# Patient Record
Sex: Female | Born: 1985 | Hispanic: Yes | Marital: Married | Smoking: Never smoker
Health system: Southern US, Community
[De-identification: ages and names within clinical notes are randomized; demographics above are authoritative.]

## PROBLEM LIST (undated history)

## (undated) ENCOUNTER — Inpatient Hospital Stay (HOSPITAL_COMMUNITY): Payer: Self-pay

## (undated) DIAGNOSIS — Z789 Other specified health status: Secondary | ICD-10-CM

## (undated) HISTORY — PX: NO PAST SURGERIES: SHX2092

## (undated) HISTORY — DX: Other specified health status: Z78.9

---

## 2019-10-12 NOTE — L&D Delivery Note (Addendum)
OB/GYN Faculty Practice Delivery Note  Darlene Gallagher is a 34 y.o. [phone] s/p VD at [redacted]w[redacted]d. She was admitted for SOL.   ROM: 1h 31m with clear fluid GBS Status:  Negative/-- (10/12 1418) Maximum Maternal Temperature: 98.2 F  Labor Progress: Patient presented to L&D for SOL. Initial SVE: 3/50/-1. Labor course was augmented with cytotec. She then progressed to complete and delivered alongside an in person interpreter.  Delivery Date/Time: 9:00 Delivery: Called to room and patient was complete and pushing. Head position was ROA and delivered with ease over the perineum. Nuchal cord present and easily reduced. Shoulder and body delivered in usual fashion. Infant with spontaneous cry, placed on mother's abdomen, dried and stimulated. Cord clamped x 2 after 1-minute delay, and cut by RN. Cord blood drawn. Placenta delivered spontaneously with gentle cord traction then brisk bleeding noted. Pitocin started and bleeding continued despite fundal massage therefore Methergine ordered. Fundus then became firm with continued massage and meds and bleeding slowed. Labia, perineum, vagina, and cervix inspected and significant for Bilateral Labial lacerations with perineal laceration and hemostatic anterior cervical laceration.  Baby Weight: per EHR Cord: central insertion, 3 vessel Placenta: Sent to L&D Complications: Terminal Meconium Lacerations: second degree perineal laceration (repaired with 2.0 Vicryl rapide) and bilateral labial laceration ( repaired with 3.0 vicryl rapide) in the standard fashion EBL: 250 cc Analgesia: Epidural   Infant: APGAR (1 MIN): 4   APGAR (5 MINS): 9    Kathrin Greathouse, MD, PGY-1 OBGYN Faculty Teaching Service  08/18/2020, 10:02 AM    Midwife attestation: I was gloved and present for delivery in its entirety and I agree with the above resident's note.  Donette Larry, CNM 12:02 PM

## 2019-11-07 ENCOUNTER — Emergency Department (HOSPITAL_COMMUNITY)
Admission: EM | Admit: 2019-11-07 | Discharge: 2019-11-08 | Disposition: A | Discharge status: Home / Self Care | Payer: Self-pay | Attending: Emergency Medicine | Admitting: Emergency Medicine

## 2019-11-07 ENCOUNTER — Other Ambulatory Visit: Payer: Self-pay

## 2019-11-07 ENCOUNTER — Encounter (HOSPITAL_COMMUNITY): Payer: Self-pay | Admitting: Emergency Medicine

## 2019-11-07 DIAGNOSIS — N390 Urinary tract infection, site not specified: Secondary | ICD-10-CM | POA: Insufficient documentation

## 2019-11-07 LAB — COMPREHENSIVE METABOLIC PANEL
ALT: 13 U/L (ref 0–44)
AST: 16 U/L (ref 15–41)
Albumin: 4.2 g/dL (ref 3.5–5.0)
Alkaline Phosphatase: 50 U/L (ref 38–126)
Anion gap: 9 (ref 5–15)
BUN: 14 mg/dL (ref 6–20)
CO2: 23 mmol/L (ref 22–32)
Calcium: 8.9 mg/dL (ref 8.9–10.3)
Chloride: 107 mmol/L (ref 98–111)
Creatinine, Ser: 0.63 mg/dL (ref 0.44–1.00)
GFR calc Af Amer: >60 mL/min (ref >60)
GFR calc non Af Amer: >60 mL/min (ref >60)
Glucose, Bld: 94 mg/dL (ref 70–99)
Potassium: 4 mmol/L (ref 3.5–5.1)
Sodium: 139 mmol/L (ref 135–145)
Total Bilirubin: 0.4 mg/dL (ref 0.3–1.2)
Total Protein: 7.2 g/dL (ref 6.5–8.1)

## 2019-11-07 LAB — URINALYSIS, ROUTINE W REFLEX MICROSCOPIC
Bilirubin Urine: NEGATIVE
Glucose, UA: NEGATIVE mg/dL
Ketones, ur: NEGATIVE mg/dL
Nitrite: NEGATIVE
Protein, ur: 100 mg/dL — AB
RBC / HPF: >50 RBC/hpf — ABNORMAL HIGH (ref 0–5)
Specific Gravity, Urine: 1.02 (ref 1.005–1.030)
WBC, UA: >50 WBC/hpf — ABNORMAL HIGH (ref 0–5)
pH: 7 (ref 5.0–8.0)

## 2019-11-07 LAB — CBC
HCT: 42.5 % (ref 36.0–46.0)
Hemoglobin: 13.6 g/dL (ref 12.0–15.0)
MCH: 30.2 pg (ref 26.0–34.0)
MCHC: 32 g/dL (ref 30.0–36.0)
MCV: 94.4 fL (ref 80.0–100.0)
Platelets: 313 10*3/uL (ref 150–400)
RBC: 4.5 MIL/uL (ref 3.87–5.11)
RDW: 13 % (ref 11.5–15.5)
WBC: 10 10*3/uL (ref 4.0–10.5)
nRBC: 0 % (ref 0.0–0.2)

## 2019-11-07 LAB — LIPASE, BLOOD: Lipase: 24 U/L (ref 11–51)

## 2019-11-07 LAB — I-STAT BETA HCG BLOOD, ED (MC, WL, AP ONLY): I-stat hCG, quantitative: <5 m[IU]/mL (ref <5)

## 2019-11-07 MED ORDER — SODIUM CHLORIDE 0.9% FLUSH: 3.0000 mL | Freq: Once | INTRAVENOUS | Status: DC

## 2019-11-07 NOTE — ED Triage Notes (Signed)
Patient reports LLQ pain with dysuria and urinary frequency onset today , no emesis or diarrhea , denies fever or chills .

## 2019-11-08 MED ORDER — CEPHALEXIN 500 MG PO CAPS: 500.0000 mg | ORAL_CAPSULE | Freq: Two times a day (BID) | ORAL | 0 refills | Status: AC

## 2019-11-08 MED ORDER — PHENAZOPYRIDINE HCL 200 MG PO TABS: 200.0000 mg | ORAL_TABLET | Freq: Three times a day (TID) | ORAL | 0 refills | Status: DC | PRN

## 2019-11-08 MED ORDER — KETOROLAC TROMETHAMINE 30 MG/ML IJ SOLN
30.0000 mg | Freq: Once | INTRAMUSCULAR | Status: AC
  Administered 2019-11-08: 30 mg via INTRAVENOUS
  Filled 2019-11-08: qty 1

## 2019-11-08 MED ORDER — PHENAZOPYRIDINE HCL 100 MG PO TABS
200.0000 mg | ORAL_TABLET | Freq: Once | ORAL | Status: AC
  Administered 2019-11-08: 200 mg via ORAL
  Filled 2019-11-08: qty 2

## 2019-11-08 MED ORDER — CEPHALEXIN 250 MG PO CAPS
500.0000 mg | ORAL_CAPSULE | Freq: Once | ORAL | Status: AC
  Administered 2019-11-08: 500 mg via ORAL
  Filled 2019-11-08: qty 2

## 2019-11-08 NOTE — ED Notes (Signed)
Patient verbalizes understanding of discharge instructions. Opportunity for questioning and answers were provided. All questions answered. PIV removed, catheter intact. Site dressed with gauze and tape. Armband removed by staff, pt discharged from ED. Ambulatory with strong, steady gait

## 2019-11-08 NOTE — ED Notes (Signed)
Pt ambulatory to [facility] 22 from [facility] with c/o suprapubic abd pain that began yesterday morning at 0300. Denies N/V/D. Denies CP/SOB. Pt A&Ox4. Endorses hematuria, dysuria, and increased urinary frequency. Denies fevers. Breathing easy, non-labored. VSS on continuous monitors PIV initiated, 20G to RAC. IV flushes with 10 cc NS. Positive blood return. Secured with tape and tegaderm

## 2019-11-08 NOTE — ED Notes (Addendum)
PA at bedside.

## 2019-11-08 NOTE — ED Provider Notes (Signed)
[facility] EMERGENCY DEPARTMENT Provider Note   CSN: [id number] Arrival date & time: 11/07/19  2023     History Chief Complaint  Patient presents with  . Abdominal Pain    Dysuria    Darlene Gallagher is a 34 y.o. female.  34 y/o otherwise healthy female with no reported PMH presents to the ED c/o worseining suprapubic pain and dysuria that started around 0300 yesterday morning. She also reports increased urinary frequency and decreased urinary stream. States "this feels the same as the UTI I had a few years ago". Denies fevers, chills, hematuria, N/V/D. No medications taken PTA for symptoms. No other complaints at this time.  A language interpreter was used Risk analyst).  Abdominal Pain Pain location:  Suprapubic Pain radiates to:  Does not radiate Pain severity:  Moderate Onset quality:  Sudden Duration:  1 day Timing:  Constant Progression:  Waxing and waning Chronicity:  New Relieved by:  Nothing Associated symptoms: dysuria and hematuria   Associated symptoms comment:  +urinary frequency, +decreased urinary stream Dysuria:    Timing:  Intermittent   Progression:  Worsening   Chronicity:  New Risk factors: not pregnant        History reviewed. No pertinent past medical history.  There are no problems to display for this patient.   History reviewed. No pertinent surgical history.   OB History   No obstetric history on file.     No family history on file.  Social History   Tobacco Use  . Smoking status: Never Smoker  . Smokeless tobacco: Never Used  Substance Use Topics  . Alcohol use: Never  . Drug use: Never    Home Medications Prior to Admission medications   Medication Sig Start Date End Date Taking? Authorizing Provider  cephALEXin (KEFLEX) 500 MG capsule Take 1 capsule (500 mg total) by mouth 2 (two) times daily for 7 days. 11/08/19 11/15/19  Antonietta Breach, PA-C  phenazopyridine (PYRIDIUM) 200 MG tablet Take 1  tablet (200 mg total) by mouth 3 (three) times daily as needed for pain. 11/08/19   Antonietta Breach, PA-C    Allergies    Patient has no known allergies.  Review of Systems   Review of Systems  Gastrointestinal: Positive for abdominal pain.  Genitourinary: Positive for dysuria and hematuria.  Ten systems reviewed and are negative for acute change, except as noted in the HPI.    Physical Exam Updated Vital Signs BP 114/85   Pulse 75   Temp 98.2 F (36.8 C) (Oral)   Resp 19   LMP 11/07/2019   SpO2 98%   Physical Exam Vitals and nursing note reviewed.  Constitutional:      General: She is not in acute distress.    Appearance: She is well-developed. She is not diaphoretic.     Comments: Nontoxic appearing and in NAD  HENT:     Head: Normocephalic and atraumatic.  Eyes:     General: No scleral icterus.    Conjunctiva/sclera: Conjunctivae normal.  Pulmonary:     Effort: Pulmonary effort is normal. No respiratory distress.     Comments: Respirations even and unlabored Abdominal:     General: There is no distension.  Musculoskeletal:        General: Normal range of motion.     Cervical back: Normal range of motion.  Skin:    General: Skin is warm and dry.     Coloration: Skin is not pale.     Findings:  No erythema or rash.  Neurological:     Mental Status: She is alert and oriented to person, place, and time.     Coordination: Coordination normal.  Psychiatric:        Behavior: Behavior normal.     ED Results / Procedures / Treatments   Labs (all labs ordered are listed, but only abnormal results are displayed) Labs Reviewed  URINALYSIS, ROUTINE W REFLEX MICROSCOPIC - Abnormal; Notable for the following components:      Result Value   APPearance HAZY (*)    Hgb urine dipstick MODERATE (*)    Protein, ur 100 (*)    Leukocytes,Ua LARGE (*)    RBC / HPF >50 (*)    WBC, UA >50 (*)    Bacteria, UA RARE (*)    All other components within normal limits  URINE CULTURE    LIPASE, BLOOD  COMPREHENSIVE METABOLIC PANEL  CBC  I-STAT BETA HCG BLOOD, ED ([facility], [facility], [facility] ONLY)    EKG None  Radiology No results found.  Procedures Procedures (including critical care time)  Medications Ordered in ED Medications  sodium chloride flush (NS) 0.9 % injection 3 mL (has no administration in time range)  ketorolac (TORADOL) 30 MG/ML injection 30 mg (has no administration in time range)  phenazopyridine (PYRIDIUM) tablet 200 mg (has no administration in time range)  cephALEXin (KEFLEX) capsule 500 mg (has no administration in time range)    ED Course  I have reviewed the triage vital signs and the nursing notes.  Pertinent labs & imaging results that were available during my care of the patient were reviewed by me and considered in my medical decision making (see chart for details).    MDM Rules/Calculators/A&P                      Patient has been diagnosed with a UTI. Hematuria may be due to cystitis versus contamination from menses which started today. Pt is afebrile, normotensive, and denies N/V. Patient to be discharged home with antibiotics and instructions to follow up with PCP if symptoms persist. Return precautions discussed and provided. Patient discharged in stable condition with no unaddressed concerns.   Final Clinical Impression(s) / ED Diagnoses Final diagnoses:  Acute lower UTI    Rx / DC Orders ED Discharge Orders         Ordered    cephALEXin (KEFLEX) 500 MG capsule  2 times daily     11/08/19 0047    phenazopyridine (PYRIDIUM) 200 MG tablet  3 times daily PRN     11/08/19 0047           Antony Madura, PA-C 11/08/19 0055    Zadie Rhine, MD 11/08/19 [phone]

## 2019-11-08 NOTE — Discharge Instructions (Signed)
Take Keflex as prescribed until finished. Use Pyridium as prescribed for pain. This may turn your urine orange in color temporarily. Take 600mg  Ibuprofen for persistent pain. Return for new or concerning symptoms.   Tome Keflex segn lo prescrito . Use Pyridium segn lo prescrito para el dolor. Esto puede hacer que su orina se vuelva de color naranja temporalmente. Tome 600 mg de ibuprofeno para Engineer, site. Regrese por sntomas nuevos o preocupantes.

## 2019-11-08 NOTE — ED Notes (Signed)
All meds given per [facility]. Name/DOB verified with pt. Pt ambulatory to and from [facility]. Urine culture collected, labeled with 2 pt identifiers, and sent to lab

## 2019-11-09 LAB — URINE CULTURE: Culture: 70000 — AB

## 2019-11-10 ENCOUNTER — Telehealth: Payer: Self-pay | Admitting: Emergency Medicine

## 2019-11-10 NOTE — Telephone Encounter (Signed)
Post ED Visit - Positive Culture Follow-up  Culture report reviewed by antimicrobial stewardship pharmacist: [facility] []  , Pharm.D. []  Enzo Bi, Pharm.D., BCPS AQ-ID []  , Pharm.D., BCPS []  Celedonio Miyamoto, .D., BCPS []  [place], .D., BCPS, AAHIVP []  Georgina Pillion, Pharm.D., BCPS, AAHIVP []  [address], PharmD, BCPS []  , PharmD, BCPS []  [place], PharmD, BCPS [x]  [place], PharmD []  , PharmD, BCPS []  Estella Husk, PharmD  Pharmacy Team []  Lysle Pearl, PharmD []  , PharmD []  Phillips Climes, PharmD []  , Rph []  Agapito Games) , PharmD []  Joaquim Lai, PharmD []  , PharmD []  Mervyn Gay, PharmD []  , PharmD []  Vinnie Level, PharmD []  [facility], PharmD []  , PharmD []  Len Childs, PharmD   Positive urine culture Treated with Cephalexin, organism sensitive to the same and no further patient follow-up is required at this time.  11/10/2019, 4:33 PM

## 2020-01-12 ENCOUNTER — Inpatient Hospital Stay (HOSPITAL_COMMUNITY)
Admission: AD | Admit: 2020-01-12 | Discharge: 2020-01-13 | Disposition: A | Discharge status: Other Acute Inpatient Hospital | Payer: Self-pay | Attending: Emergency Medicine | Admitting: Emergency Medicine

## 2020-01-12 ENCOUNTER — Encounter (HOSPITAL_COMMUNITY): Payer: Self-pay | Admitting: Obstetrics & Gynecology

## 2020-01-12 ENCOUNTER — Other Ambulatory Visit: Payer: Self-pay

## 2020-01-12 ENCOUNTER — Inpatient Hospital Stay (HOSPITAL_COMMUNITY): Payer: Self-pay

## 2020-01-12 DIAGNOSIS — O468X1 Other antepartum hemorrhage, first trimester: Secondary | ICD-10-CM

## 2020-01-12 DIAGNOSIS — O209 Hemorrhage in early pregnancy, unspecified: Secondary | ICD-10-CM | POA: Insufficient documentation

## 2020-01-12 DIAGNOSIS — O468X9 Other antepartum hemorrhage, unspecified trimester: Secondary | ICD-10-CM

## 2020-01-12 DIAGNOSIS — Z603 Acculturation difficulty: Secondary | ICD-10-CM

## 2020-01-12 DIAGNOSIS — Z789 Other specified health status: Secondary | ICD-10-CM

## 2020-01-12 DIAGNOSIS — Z3A09 9 weeks gestation of pregnancy: Secondary | ICD-10-CM | POA: Insufficient documentation

## 2020-01-12 DIAGNOSIS — Z3490 Encounter for supervision of normal pregnancy, unspecified, unspecified trimester: Secondary | ICD-10-CM

## 2020-01-12 DIAGNOSIS — O418X10 Other specified disorders of amniotic fluid and membranes, first trimester, not applicable or unspecified: Secondary | ICD-10-CM

## 2020-01-12 DIAGNOSIS — O418X90 Other specified disorders of amniotic fluid and membranes, unspecified trimester, not applicable or unspecified: Secondary | ICD-10-CM

## 2020-01-12 LAB — CBC
HCT: 37.8 % (ref 36.0–46.0)
Hemoglobin: 12.9 g/dL (ref 12.0–15.0)
MCH: 31.2 pg (ref 26.0–34.0)
MCHC: 34.1 g/dL (ref 30.0–36.0)
MCV: 91.3 fL (ref 80.0–100.0)
Platelets: 252 10*3/uL (ref 150–400)
RBC: 4.14 MIL/uL (ref 3.87–5.11)
RDW: 12.3 % (ref 11.5–15.5)
WBC: 9.6 10*3/uL (ref 4.0–10.5)
nRBC: 0 % (ref 0.0–0.2)

## 2020-01-12 LAB — ABO/RH: ABO/RH(D): O POS

## 2020-01-12 LAB — POCT PREGNANCY, URINE: Preg Test, Ur: POSITIVE — AB

## 2020-01-12 NOTE — MAU Note (Signed)
.  Darlene Gallagher is a 34 y.o. at [redacted]w[redacted]d here in MAU reporting: vaginal bleeding since 2100. Pt states she has not used any specific number of pads because [facility]. Pt denies any pain currently but had some cramping last night.  LMP: 11/07/2019 Onset of complaint: 2100 Pain score: 0/10

## 2020-01-12 NOTE — MAU Provider Note (Signed)
History     CSN: [id number]  Arrival date and time: 01/12/20 2148   First Provider Initiated Contact with Patient 01/12/20 2311      Chief Complaint  Patient presents with  . Vaginal Bleeding   HPI Darlene Gallagher is a 34 y.o. G3P2002 at [redacted]w[redacted]d who presents to MAU with chief complaint of vaginal bleeding. This is a new problem, onset tonight 01/12/2020 at 2100. Patient reported to [facility] immediately after bleeding started and is unaware of how heavy her bleeding is. Patient also c/o of mild abdominal cramping, onset last night 01/11/2020 and resolving overnight without intervention. She denies dysuria, abdominal pain, fever or recent illness. She is remote from sexual intercourse.  OB History    Gravida  3   Para  2   Term  2   Preterm      AB      Living  2     SAB      TAB      Ectopic      Multiple      Live Births  2           No past medical history on file.  History reviewed. No pertinent surgical history.  History reviewed. No pertinent family history.  Social History   Tobacco Use  . Smoking status: Never Smoker  . Smokeless tobacco: Never Used  Substance Use Topics  . Alcohol use: Never  . Drug use: Never    Allergies: No Known Allergies  Medications Prior to Admission  Medication Sig Dispense Refill Last Dose  . Prenatal Vit-Fe Fumarate-FA (MULTIVITAMIN-PRENATAL) 27-0.8 MG TABS tablet Take 1 tablet by mouth daily at 12 noon.   01/12/2020 at Unknown time  . phenazopyridine (PYRIDIUM) 200 MG tablet Take 1 tablet (200 mg total) by mouth 3 (three) times daily as needed for pain. 5 tablet 0     Review of Systems  Respiratory: Negative for shortness of breath.   Gastrointestinal: Negative for abdominal pain.  Genitourinary: Positive for vaginal bleeding.  Musculoskeletal: Negative for back pain.  Neurological: Negative for dizziness and weakness.  All other systems reviewed and are negative.  Physical Exam   Blood pressure  113/77, pulse 64, temperature 98.1 F (36.7 C), temperature source Oral, resp. rate 18, last menstrual period 11/07/2019, SpO2 100 %.  Physical Exam  Nursing note and vitals reviewed. Constitutional: She is oriented to person, place, and time. She appears well-developed and well-nourished.  Cardiovascular: Normal rate and normal heart sounds.  Respiratory: Effort normal.  GI: Soft. Bowel sounds are normal.  Genitourinary:    Vaginal discharge present.     Genitourinary Comments: Pelvic exam: External genitalia normal, vaginal walls pink and well rugated, cervix visually closed, no lesions noted. Moderate volume dark red bleeding noted on spec exam. Removed with fox swabs x 2.    Neurological: She is oriented to person, place, and time.  Skin: Skin is warm and dry.  Psychiatric: She has a normal mood and affect. Her behavior is normal. Judgment and thought content normal.    MAU Course/MDM  Procedures: speculum exam, ultrasound  Patient Vitals for the past 24 hrs:  BP Temp Temp src Pulse Resp SpO2  01/13/20 0037 110/71 - - 65 - -  01/12/20 2303 113/77 - - 64 18 -  01/12/20 2153 133/84 98.1 F (36.7 C) Oral 70 19 100 %   Results for orders placed or performed during the hospital encounter of 01/12/20 (from the past 24 hour(s))  Pregnancy, urine POC     Status: Abnormal   Collection Time: 01/12/20 10:30 PM  Result Value Ref Range   Preg Test, Ur POSITIVE (A) NEGATIVE  CBC     Status: None   Collection Time: 01/12/20 11:04 PM  Result Value Ref Range   WBC 9.6 4.0 - 10.5 K/uL   RBC 4.14 3.87 - 5.11 MIL/uL   Hemoglobin 12.9 12.0 - 15.0 g/dL   HCT 37.8 36.0 - 46.0 %   MCV 91.3 80.0 - 100.0 fL   MCH 31.2 26.0 - 34.0 pg   MCHC 34.1 30.0 - 36.0 g/dL   RDW 12.3 11.5 - 15.5 %   Platelets 252 150 - 400 K/uL   nRBC 0.0 0.0 - 0.2 %  Comprehensive metabolic panel     Status: Abnormal   Collection Time: 01/12/20 11:04 PM  Result Value Ref Range   Sodium 135 135 - 145 mmol/L    Potassium 3.3 (L) 3.5 - 5.1 mmol/L   Chloride 104 98 - 111 mmol/L   CO2 22 22 - 32 mmol/L   Glucose, Bld 91 70 - 99 mg/dL   BUN 6 6 - 20 mg/dL   Creatinine, Ser 0.41 (L) 0.44 - 1.00 mg/dL   Calcium 8.6 (L) 8.9 - 10.3 mg/dL   Total Protein 6.5 6.5 - 8.1 g/dL   Albumin 3.6 3.5 - 5.0 g/dL   AST 15 15 - 41 U/L   ALT 11 0 - 44 U/L   Alkaline Phosphatase 37 (L) 38 - 126 U/L   Total Bilirubin 0.5 0.3 - 1.2 mg/dL   GFR calc non Af Amer >60 >60 mL/min   GFR calc Af Amer >60 >60 mL/min   Anion gap 9 5 - 15  ABO/Rh     Status: None   Collection Time: 01/12/20 11:04 PM  Result Value Ref Range   ABO/RH(D) O POS    No rh immune globuloin      NOT A RH IMMUNE GLOBULIN CANDIDATE, PT RH POSITIVE Performed at [facility], [address] [place], [place] [address]    [place] Comp Less 14 Wks  Result Date: 01/12/2020 CLINICAL DATA:  Vaginal bleeding EXAM: OBSTETRIC <14 WK ULTRASOUND TECHNIQUE: Transabdominal ultrasound was performed for evaluation of the gestation as well as the maternal uterus and adnexal regions. COMPARISON:  None. FINDINGS: Intrauterine gestational sac: Single Yolk sac:  Absent Embryo:  Present Cardiac Activity: Present Heart Rate: 169 bpm CRL:   32 mm   10 w 1 d                  [place] EDC: 08/08/2020 Subchorionic hemorrhage: Moderate-sized subchorionic hemorrhage is noted inferiorly. This measures approximately 3 cm in greatest dimension. Maternal uterus/adnexae: Ovaries are within normal limits. No free fluid is seen. IMPRESSION: Single live intrauterine gestation at 10 weeks 1 day. Moderate subchorionic hemorrhage is noted. Electronically Signed   By: Inez Catalina M.D.   On: 01/12/2020 23:50   Meds ordered this encounter  Medications  . Prenatal Vit-Fe Fumarate-FA (PREPLUS) 27-1 MG TABS    Sig: Take 1 tablet by mouth daily.    Dispense:  30 tablet    Refill:  13    Order Specific Question:   Supervising Provider    Answer:   Florian Buff [[id number]]   Assessment and Plan   --34 y.o. G3P2002 at [redacted]w[redacted]d  --Moderate subchorionic hematoma --Discussed pelvic rest, falls precautions --Blood type O POS --Language barrier: interpreter utilized  for all patient interaction --Discharge home in stable condition  F/U --Patient to establish prenatal care  Calvert Cantor, CNM 01/13/2020, 12:45 AM

## 2020-01-12 NOTE — ED Triage Notes (Signed)
2 months pregnant.  Vaginal bleeding, abd pain.

## 2020-01-13 DIAGNOSIS — O418X10 Other specified disorders of amniotic fluid and membranes, first trimester, not applicable or unspecified: Secondary | ICD-10-CM

## 2020-01-13 DIAGNOSIS — O4691 Antepartum hemorrhage, unspecified, first trimester: Secondary | ICD-10-CM

## 2020-01-13 DIAGNOSIS — O418X90 Other specified disorders of amniotic fluid and membranes, unspecified trimester, not applicable or unspecified: Secondary | ICD-10-CM

## 2020-01-13 DIAGNOSIS — Z3490 Encounter for supervision of normal pregnancy, unspecified, unspecified trimester: Secondary | ICD-10-CM

## 2020-01-13 DIAGNOSIS — Z789 Other specified health status: Secondary | ICD-10-CM

## 2020-01-13 DIAGNOSIS — Z3A10 10 weeks gestation of pregnancy: Secondary | ICD-10-CM

## 2020-01-13 LAB — COMPREHENSIVE METABOLIC PANEL
ALT: 11 U/L (ref 0–44)
AST: 15 U/L (ref 15–41)
Albumin: 3.6 g/dL (ref 3.5–5.0)
Alkaline Phosphatase: 37 U/L — ABNORMAL LOW (ref 38–126)
Anion gap: 9 (ref 5–15)
BUN: 6 mg/dL (ref 6–20)
CO2: 22 mmol/L (ref 22–32)
Calcium: 8.6 mg/dL — ABNORMAL LOW (ref 8.9–10.3)
Chloride: 104 mmol/L (ref 98–111)
Creatinine, Ser: 0.41 mg/dL — ABNORMAL LOW (ref 0.44–1.00)
GFR calc Af Amer: >60 mL/min (ref >60)
GFR calc non Af Amer: >60 mL/min (ref >60)
Glucose, Bld: 91 mg/dL (ref 70–99)
Potassium: 3.3 mmol/L — ABNORMAL LOW (ref 3.5–5.1)
Sodium: 135 mmol/L (ref 135–145)
Total Bilirubin: 0.5 mg/dL (ref 0.3–1.2)
Total Protein: 6.5 g/dL (ref 6.5–8.1)

## 2020-01-13 LAB — HCG, QUANTITATIVE, PREGNANCY: hCG, Beta Chain, Quant, S: 126359 m[IU]/mL — ABNORMAL HIGH (ref <5)

## 2020-01-13 MED ORDER — PREPLUS 27-1 MG PO TABS: 1.0000 | ORAL_TABLET | Freq: Every day | ORAL | 13 refills | Status: DC

## 2020-01-13 NOTE — Discharge Instructions (Signed)
Hematoma subcoriónico °Subchorionic Hematoma ° °Un hematoma subcoriónico es una acumulación de sangre entre la pared externa del embrión (corion) y la pared interna de la matriz (útero). °Esta afección puede causar hemorragia vaginal. Si causan poca o nada de hemorragia vaginal, generalmente, los hematomas pequeños que ocurren al principio del embarazo se reducen por su propia cuenta y no afectan al bebé ni al embarazo. Cuando la hemorragia comienza más tarde en el embarazo, o el hematoma es más grande o se produce en una paciente de edad avanzada, la afección puede ser más grave. Los hematomas más grandes pueden agrandarse aún más, lo que aumenta las posibilidades de aborto espontáneo. Esta afección también aumenta los siguientes riesgos: °· Separación prematura de la placenta del útero. °· Parto antes de término (prematuro). °· Muerte fetal. °¿Cuáles son las causas? °Se desconoce la causa exacta de esta afección. Ocurre cuando la sangre queda atrapada entre la placenta y la pared uterina porque la placenta se ha separado del lugar original del implante. °¿Qué incrementa el riesgo? °Es más probable que desarrolle esta afección si: °· Recibió tratamiento con medicamentos para la fertilidad. °· La concepción se realizó a través de la fertilización in vitro (FIV). °¿Cuáles son los signos o los síntomas? °Los síntomas de esta afección incluyen los siguientes: °· Pérdida o hemorragia vaginal. °· Contracciones del útero. Estas contracciones provocan dolor abdominal. °En ocasiones, puede no haber síntomas y la hemorragia solo se puede ver cuando se toman imágenes ecográficas (ecografía transvaginal). °¿Cómo se diagnostica? °Esta afección se diagnostica con un examen físico. Es un examen pélvico. También pueden hacerle otros estudios, por ejemplo: °· Análisis de sangre. °· Análisis de orina. °· Ecografía del abdomen. °¿Cómo se trata? °El tratamiento de esta afección puede variar. El tratamiento puede incluir lo  siguiente: °· Observación cautelosa. La observarán atentamente para detectar cualquier cambio en la hemorragia. Durante esta etapa: °? El hematoma puede reabsorberse en el cuerpo. °? El hematoma puede separar el espacio lleno de líquido que contiene al embrión (saco gestacional) de la pared del útero (endometrio). °· Medicamentos. °· Restricción de las actividades. Puede ser necesaria hasta que se detenga la hemorragia. °Siga estas indicaciones en su casa: °· Haga reposo en cama si se lo indica el médico. °· No levante ningún objeto que pese más de 10 libras (4,5 kg) o siga las indicaciones del médico. °· No consuma ningún producto que contenga nicotina o tabaco, como cigarrillos y cigarrillos electrónicos. Si necesita ayuda para dejar de fumar, consulte al médico. °· Lleve un registro escrito de la cantidad de toallas higiénicas que utiliza cada día y cuán empapadas (saturadas) están. °· No use tampones. °· Concurra a todas las visitas de control como se lo haya indicado el médico. Esto es importante. El profesional podrá pedirle que se realice análisis de seguimiento, ecografías o ambas. °Comuníquese con un médico si: °· Tiene una hemorragia vaginal. °· Tiene fiebre. °Solicite ayuda de inmediato si: °· Siente calambres intensos en el estómago, en la espalda, en el abdomen o en la pelvis. °· Elimina coágulos o tejidos grandes. Guarde los tejidos para que su médico los vea. °· Tiene más hemorragia vaginal, y se desmaya o se siente mareada o débil. °Resumen °· Un hematoma subcoriónico es una acumulación de sangre entre la pared externa de la placenta y el útero. °· Esta afección puede causar hemorragia vaginal. °· En ocasiones, puede no haber síntomas y la hemorragia solo se puede ver cuando se toman imágenes ecográficas. °· El tratamiento puede incluir una observación cautelosa, medicamentos   o restriccin de las [address]. Esta informacin no tiene Theme park manager el consejo del mdico. Asegrese de hacerle  al mdico cualquier pregunta que tenga. Document Revised: 07/07/2017 Document Reviewed: 07/07/2017 Elsevier Patient Education  2020 [employer].  Prenatal Care Providers           [facility] for [facility] @ [facility]   Phone: [phone]  Center for [facility] @ [facility]   Phone: [phone]  [facility] @[place]       Phone: [phone]            [facility] for [facility] @ [place]     Phone: [phone]          Center for [facility] @ [facility]   Phone: [phone]  Center for [facility] @ [facility]  Phone: [phone]  [facility] for [facility] @ [facility] Phone: [phone]     [facility]  Phone: [phone]  [facility]  Phone: [phone]  [phone] OB/GYN Phone: [phone]  [facility] Phone: [phone]  [facility] Phone: [phone]  [facility] Phone: [phone]  [facility]  Phone: [phone]

## 2020-01-13 NOTE — ED Provider Notes (Signed)
MSE was initiated and I personally evaluated the patient and placed orders (if any) at  12:18 AM on January 13, 2020.  The patient appears stable so that the remainder of the MSE may be completed by another provider.  Patient presents today for evaluation of vaginal bleeding and abdominal pain and cramping that started this afternoon. She is G3P2002 she reports that the bleeding started at 9 PM tonight.  She denies feeling lightheaded or dizzy.  Does not take any blood thinning medications.  She is approximately 9 weeks 3 days pregnant.  Vitals show patient appears hemodynamically stable without significant hypotension or tachycardia. I spoke with Dr. Despina Hidden from OB/GYN who accepts patient for transfer to MAU.   Cristina Gong, PA-C 01/13/20 0019    Raeford Razor, MD 01/13/20 2119

## 2020-02-06 ENCOUNTER — Other Ambulatory Visit: Payer: Self-pay

## 2020-02-06 ENCOUNTER — Ambulatory Visit (INDEPENDENT_AMBULATORY_CARE_PROVIDER_SITE_OTHER): Payer: Self-pay | Admitting: *Deleted

## 2020-02-06 VITALS — Ht 61.81 in

## 2020-02-06 DIAGNOSIS — O468X1 Other antepartum hemorrhage, first trimester: Secondary | ICD-10-CM

## 2020-02-06 DIAGNOSIS — O418X10 Other specified disorders of amniotic fluid and membranes, first trimester, not applicable or unspecified: Secondary | ICD-10-CM

## 2020-02-06 DIAGNOSIS — Z550 Illiteracy and low-level literacy: Secondary | ICD-10-CM

## 2020-02-06 DIAGNOSIS — Z789 Other specified health status: Secondary | ICD-10-CM

## 2020-02-06 DIAGNOSIS — Z3490 Encounter for supervision of normal pregnancy, unspecified, unspecified trimester: Secondary | ICD-10-CM | POA: Insufficient documentation

## 2020-02-06 NOTE — Progress Notes (Signed)
I connected with  Darlene Gallagher on 02/06/20 at  3:30 PM EDT by telephone and verified that I am speaking with the correct person using two identifiers.   I discussed the limitations, risks, security and privacy concerns of performing an evaluation and management service by telephone and the availability of in person appointments. I also discussed with the patient that there may be a patient responsible charge related to this service. The patient expressed understanding and agreed to proceed.  I explained I am completing her New OB Intake today. We discussed Her EDD and that it is based on early [place] due to unsure LMP After much questioning patient unable to clarify if LMP 11/07/19 was correct or if was a few days or a few weeks from that.Patient reports bleeding after much questioning She states she had bleeding  For 10 days like a period  Off and on with some blood clots. Then she started bleeding again  4 days on 02/01/20 and it was light. States no bleeding today. We discussed she had moderate sub hemorrhage and some bleeding can be normal; but is possible to have miscarriage. She states she feels baby movement. Instructed her if she has heavy bleeding like a period should go back to the hospital for evaluation. Informed her if she does not have heavy bleeding we will keep her appointment next week and will check FHR at visit. During call phone call was dropped. Called back again with another Interpreter.  I reviewed her allergies, meds, OB History, Medical /Surgical history, and appropriate screenings.Many questions had to be asked repeatedly to help patient understand questions and answer appropriately.I informed her we will have her take her blood pressure weekly and asked if she has a cuff. She states she has a cuff.I asked her to  bring the blood pressure cuff with her to her first ob appointment so we can show her how to use it. Explained  then we will have her take her blood pressure weekly  and record. I explained she will have some visits in office and some virtually. We will assist with download virtual app.  I reviewed her new ob  appointment date/ time with her , our location ( I gave her new address and phone number) and to wear mask, no visitors.  I explained she will have a pelvic exam, ob bloodwork, hemoglobin a1C, cbg ,pap, and  genetic testing if desired,- she is unsure about a panorama. I  Informed her we will schedule an Korea at 19 weeks and give her the appointment at her new ob visit. She voices understanding.  Reeve Turnley,RN 02/06/2020  3:24 PM

## 2020-02-14 ENCOUNTER — Ambulatory Visit (INDEPENDENT_AMBULATORY_CARE_PROVIDER_SITE_OTHER): Payer: Self-pay | Admitting: Obstetrics and Gynecology

## 2020-02-14 ENCOUNTER — Other Ambulatory Visit: Payer: Self-pay

## 2020-02-14 DIAGNOSIS — Z3A14 14 weeks gestation of pregnancy: Secondary | ICD-10-CM

## 2020-02-14 DIAGNOSIS — Z3490 Encounter for supervision of normal pregnancy, unspecified, unspecified trimester: Secondary | ICD-10-CM

## 2020-02-14 DIAGNOSIS — Z3482 Encounter for supervision of other normal pregnancy, second trimester: Secondary | ICD-10-CM

## 2020-02-14 NOTE — Patient Instructions (Signed)
Primer trimestre de Psychiatrist First Trimester of Pregnancy  El primer trimestre de Psychiatrist se extiende desde la semana1 hasta el final de la semana13 (mes1 al mes3). Durante este tiempo, el beb comenzar a desarrollarse dentro suyo. Entre la semana6 y [place], se forman los ojos y Recruitment consultant, y los latidos del corazn pueden escucharse en la ecografa. Al final de las 12semanas, todos los rganos del beb estn formados. El cuidado prenatal es toda la asistencia mdica que usted recibe antes del nacimiento del beb. Asegrese de recibir un buen cuidado prenatal y de seguir todas las indicaciones del mdico. Siga estas indicaciones en su casa: Medicamentos  Tome los medicamentos de venta libre y los recetados solamente como se lo haya indicado el mdico. Algunos medicamentos son seguros para tomar durante el Psychiatrist y otros no lo son.  Tome vitaminas prenatales que contengan por lo menos (?g) de cido flico.  Si tiene problemas para defecar (estreimiento), tome un medicamento que ablanda la materia fecal (laxante), siempre que lo autorice el mdico. Comida y bebida   Ingiera alimentos saludables de [place] regular.  El Firefighter la cantidad de peso que [place].  No coma carne cruda ni quesos sin cocinar.  Si tiene Programme researcher, broadcasting/film/video (nuseas) o vomita (vmitos): ? Ingiera 4 o 5comidas pequeas por Geophysical data processor de 3abundantes. ? Intente comer algunas galletitas saladas. ? Beba lquidos [employer], en lugar de [employer].  Para evitar el estreimiento: ? Consuma alimentos ricos en fibra, como frutas y verduras frescas, cereales integrales y legumbres. ? Beba suficiente lquido para mantener el pis (orina) claro o de color amarillo plido. Actividad  Haga ejercicios solamente como se lo haya indicado el mdico. Deje de hacer ejercicios si tiene clicos o dolor en la parte baja del vientre (abdomen) o en la cintura.  No haga  actividad fsica si el clima est demasiado caluroso o hmedo, o si se encuentra en un lugar muy alto (altitud elevada).  Intente no estar de pie [employer]Ex. Mueva las piernas con frecuencia si debe estar de pie en un lugar durante mucho tiempo.  Evite levantar pesos [employer].  Use zapatos con tacones bajos. Mantenga una buena postura al sentarse y pararse.  Puede tener [employer] [place] [employer], a menos que el mdico le indique lo contrario. Alivio del dolor y del Dentist  Use un sostn que le brinde buen soporte si le duelen las [place].  Dese baos de asiento con agua tibia para Engineer, materials o las molestias causadas por las hemorroides. Use una crema antihemorroidal si el mdico se lo permite.  Descanse con las piernas elevadas si tiene calambres o dolor de cintura.  Si tiene las venas de las piernas hinchadas y abultadas (venas varicosas): ? Use medias elsticas de soporte o medias de compresin como se lo haya indicado el mdico. ? Levante (eleve) los pies durante , 3 o 4veces por Futures trader. ? Limite la sal en sus alimentos. Cuidado prenatal  Programe las visitas prenatales para la semana12 de [place].  Escriba sus preguntas. Llvelas cuando concurra a las visitas prenatales.  Concurra a todas las visitas prenatales como se lo haya indicado el mdico. Esto es importante. Seguridad  Use el cinturn de seguridad en todo momento mientras conduce.  Haga una lista con los nmeros de telfono en caso de Associate Professor. Esta lista debe incluir los nmeros de los familiares, los amigos, el hospital y los departamentos de polica y de bomberos. Instrucciones generales  Pdale  al mdico que la derive a clases prenatales en su localidad. Debe comenzar a tomar las clases antes de entrar en el mes6 de embarazo.  Pida ayuda si necesita asesoramiento o asistencia con la alimentacin. El mdico puede aconsejarla o indicarle dnde recurrir para recibir ayuda.  No se d baos de  inmersin en agua caliente, baos turcos ni saunas.  No se haga duchas vaginales ni use tampones o toallas higinicas perfumadas.  No mantenga las piernas cruzadas durante mucho tiempo.  Evite las hierbas y el alcohol. Evite los frmacos que el mdico no haya autorizado.  No consuma ningn producto que contenga tabaco, lo que incluye cigarrillos, tabaco de mascar o cigarrillos electrnicos. Si necesita ayuda para dejar de fumar, consulte al mdico. Puede recibir asesoramiento u otro tipo de apoyo para dejar de fumar.  Evite el contacto con las bandejas sanitarias de los gatos y la tierra que estos animales usan. Estos elementos contienen grmenes que pueden causar defectos congnitos al beb y la posible prdida del feto (aborto espontneo) o muerte fetal.  Visite al dentista. En su casa, lvese los dientes con un cepillo dental suave. Psese el hilo dental con suavidad. Comunquese con un mdico si:  Tiene mareos.  Tiene clicos leves o siente presin en la parte baja del vientre.  Sufre un dolor persistente en el abdomen.  Sigue teniendo malestar estomacal, vomita o la materia fecal es lquida (diarrea).  Nota una secrecin de lquido con olor ftido que proviene de la vagina.  Tiene dolor al hacer pis (orinar).  Tiene el rostro, las manos, las piernas o los tobillos ms hinchados (inflamados). Solicite ayuda de inmediato si:  Tiene fiebre.  Tiene una prdida de lquido por la vagina.  Tiene sangrado o pequeas prdidas vaginales.  Tiene clicos o dolor muy intensos en el vientre.  Sube o baja de peso rpidamente.  Vomita sangre. Esto tiene un aspecto similar a la borra del caf.  Est en contacto con personas que tienen rubola, la quinta enfermedad o varicela.  Siente un dolor de cabeza muy intenso.  Le falta el aire.  Sufre cualquier tipo de traumatismo, por ejemplo, debido a una cada o un accidente automovilstico. Resumen  El primer trimestre de embarazo se  extiende desde la semana1 hasta el final de la semana13 (mes1 al mes3).  Para cuidar su salud y la del beb en gestacin, necesitar consumir alimentos saludables, tomar medicamentos solamente si lo autoriza el mdico, y hacer actividades que sean seguras para usted y para su beb.  Concurra a todas las visitas de control como se lo haya indicado el mdico. Esto es importante porque el mdico deber asegurar que el beb est saludable y est creciendo bien. Esta informacin no tiene como fin reemplazar el consejo del mdico. Asegrese de hacerle al mdico cualquier pregunta que tenga. Document Revised: 05/03/2017 Document Reviewed: 05/03/2017 Elsevier Patient Education  2020 Elsevier Inc.  

## 2020-02-14 NOTE — Progress Notes (Signed)
History:   Palmira Stickle is a 34 y.o. [phone] at [redacted]w[redacted]d by LMP being seen today for her first obstetrical visit.  Her obstetrical history is significant for limited literacy, subchorionic hemorrhage, spanish speaking. Patient does intend to breast feed. Pregnancy history fully reviewed.  Patient reports no complaints.  HISTORY: OB History  Gravida Para Term Preterm AB Living  4 2 2  0 1 2  SAB TAB Ectopic Multiple Live Births  1 0 0 0 2    # Outcome Date GA Lbr Len/2nd Weight Sex Delivery Anes PTL Lv  4 Current           3 SAB 2020             Birth Comments: unsure of date of miscarriage  2 Term 05/07/05   7 lb 4.4 oz (3.3 kg) F Vag-Spont   LIV     Birth Comments: Stayed in hospital one week, labor was augmented with injection and it took several days  1 Term 07/31/03   7 lb 15 oz (3.6 kg) M Vag-Spont   LIV     Birth Comments: wnl    Last pap smear was done 2020 and was normal  No past medical history on file. No past surgical history on file. No family history on file. Social History   Tobacco Use  . Smoking status: Never Smoker  . Smokeless tobacco: Never Used  Substance Use Topics  . Alcohol use: Never  . Drug use: Never   No Known Allergies Current Outpatient Medications on File Prior to Visit  Medication Sig Dispense Refill  . Prenatal Vit-Fe Fumarate-FA (PREPLUS) 27-1 MG TABS Take 1 tablet by mouth daily. 30 tablet 13   No current facility-administered medications on file prior to visit.    Review of Systems Pertinent items noted in HPI and remainder of comprehensive ROS otherwise negative. Physical Exam:   Vitals:   02/14/20 1339  BP: 100/69  Pulse: 72  Weight: 127 lb 3.2 oz (57.7 kg)   Fetal Heart Rate (bpm): 152 Uterus:     System: General: well-developed, well-nourished female in no acute distress   Skin: normal coloration and turgor, no rashes   Neurologic: oriented, normal, negative, normal mood   Extremities: normal strength,  tone, and muscle mass, ROM of all joints is normal   HEENT PERRLA, extraocular movement intact and sclera clear, anicteric   Mouth/Teeth mucous membranes moist, pharynx normal without lesions and dental hygiene good   Neck supple and no masses   Cardiovascular: regular rate and rhythm   Respiratory:  no respiratory distress, normal breath sounds   Abdomen: soft, non-tender; bowel sounds normal; no masses,  no organomegaly   Assessment:  Pregnancy: 04/15/20 Patient Active Problem List   Diagnosis Date Noted  . Supervision of low-risk pregnancy 02/06/2020  . Limited literacy 02/06/2020  . Intrauterine pregnancy 01/13/2020  . Subchorionic hemorrhage 01/13/2020  . Language barrier affecting health care 01/13/2020     Plan:   1. Encounter for supervision of low-risk pregnancy, antepartum  - Obstetric Panel, Including HIV; Future - Hemoglobin A1c; Future - Genetic Screening; Future - lab not available today, patient to return next week for labs only - request records from HD.   Continue prenatal vitamins. Genetic Screening discussed, NIPS: requested. Ultrasound discussed; fetal anatomic survey: ordered. Problem list reviewed and updated. The nature of [facility] with multiple MDs and other Advanced Practice Providers was explained to patient; also emphasized  that residents, students are part of our team. Routine obstetric precautions reviewed. Return in about 1 week (around 02/21/2020), or Come back next week for labs., for Needs to sign consent for records from HD.     Moroni Nester, Artist Pais, [place] [facility] for [employer], [place]

## 2020-02-21 ENCOUNTER — Other Ambulatory Visit: Payer: Self-pay

## 2020-02-21 DIAGNOSIS — Z3490 Encounter for supervision of normal pregnancy, unspecified, unspecified trimester: Secondary | ICD-10-CM

## 2020-02-21 DIAGNOSIS — O468X1 Other antepartum hemorrhage, first trimester: Secondary | ICD-10-CM

## 2020-02-22 LAB — OBSTETRIC PANEL, INCLUDING HIV
Antibody Screen: NEGATIVE
Basophils Absolute: 0.1 10*3/uL (ref 0.0–0.2)
Basos: 1 %
EOS (ABSOLUTE): 0.3 10*3/uL (ref 0.0–0.4)
Eos: 4 %
HIV Screen 4th Generation wRfx: NONREACTIVE
Hematocrit: 36.4 % (ref 34.0–46.6)
Hemoglobin: 12.1 g/dL (ref 11.1–15.9)
Hepatitis B Surface Ag: NEGATIVE
Immature Grans (Abs): 0 10*3/uL (ref 0.0–0.1)
Immature Granulocytes: 1 %
Lymphocytes Absolute: 1.4 10*3/uL (ref 0.7–3.1)
Lymphs: 17 %
MCH: 30.3 pg (ref 26.6–33.0)
MCHC: 33.2 g/dL (ref 31.5–35.7)
MCV: 91 fL (ref 79–97)
Monocytes Absolute: 0.4 10*3/uL (ref 0.1–0.9)
Monocytes: 6 %
Neutrophils Absolute: 5.7 10*3/uL (ref 1.4–7.0)
Neutrophils: 71 %
Platelets: 263 10*3/uL (ref 150–450)
RBC: 4 x10E6/uL (ref 3.77–5.28)
RDW: 13.1 % (ref 11.7–15.4)
RPR Ser Ql: NONREACTIVE
Rh Factor: POSITIVE
Rubella Antibodies, IGG: 15.5 index (ref >0.99)
WBC: 7.9 10*3/uL (ref 3.4–10.8)

## 2020-02-22 LAB — HEMOGLOBIN A1C
Est. average glucose Bld gHb Est-mCnc: 100 mg/dL
Hgb A1c MFr Bld: 5.1 % (ref 4.8–5.6)

## 2020-03-13 ENCOUNTER — Ambulatory Visit (INDEPENDENT_AMBULATORY_CARE_PROVIDER_SITE_OTHER): Payer: Self-pay | Admitting: Obstetrics and Gynecology

## 2020-03-13 ENCOUNTER — Encounter: Payer: Self-pay | Admitting: Obstetrics and Gynecology

## 2020-03-13 ENCOUNTER — Other Ambulatory Visit: Payer: Self-pay

## 2020-03-13 VITALS — BP 98/56 | HR 71 | Wt 126.7 lb

## 2020-03-13 DIAGNOSIS — Z789 Other specified health status: Secondary | ICD-10-CM

## 2020-03-13 DIAGNOSIS — O418X10 Other specified disorders of amniotic fluid and membranes, first trimester, not applicable or unspecified: Secondary | ICD-10-CM

## 2020-03-13 DIAGNOSIS — Z3490 Encounter for supervision of normal pregnancy, unspecified, unspecified trimester: Secondary | ICD-10-CM

## 2020-03-13 DIAGNOSIS — Z3A18 18 weeks gestation of pregnancy: Secondary | ICD-10-CM

## 2020-03-13 DIAGNOSIS — O208 Other hemorrhage in early pregnancy: Secondary | ICD-10-CM

## 2020-03-13 DIAGNOSIS — Z550 Illiteracy and low-level literacy: Secondary | ICD-10-CM

## 2020-03-13 NOTE — Progress Notes (Signed)
   PRENATAL VISIT NOTE  Subjective:  Darlene Gallagher is a 34 y.o. [phone] at [redacted]w[redacted]d being seen today for ongoing prenatal care.  She is currently monitored for the following issues for this high-risk pregnancy and has Intrauterine pregnancy; Subchorionic hemorrhage; Language barrier affecting health care; Supervision of low-risk pregnancy; and Limited literacy on their problem list.  Patient reports no further bleeding, feeling well with no complaints..  Contractions: Not present. Vag. Bleeding: None.  Movement: Present. Denies leaking of fluid.   The following portions of the patient's history were reviewed and updated as appropriate: allergies, current medications, past family history, past medical history, past social history, past surgical history and problem list.   Objective:   Vitals:   03/13/20 1030  BP: (!) 98/56  Pulse: 71  Weight: 126 lb 11.2 oz (57.5 kg)   Fetal Status: Fetal Heart Rate (bpm): 146   Movement: Present     General:  Alert, oriented and cooperative. Patient is in no acute distress.  Skin: Skin is warm and dry. No rash noted.   Cardiovascular: Normal heart rate noted  Respiratory: Normal respiratory effort, no problems with respiration noted  Abdomen: Soft, gravid, appropriate for gestational age.  Pain/Pressure: Absent     Pelvic: Cervical exam deferred        Extremities: Normal range of motion.  Edema: None  Mental Status: Normal mood and affect. Normal behavior. Normal judgment and thought content.   Assessment and Plan:  Pregnancy: [id number] at [redacted]w[redacted]d  1. Encounter for supervision of low-risk pregnancy, antepartum  2. Language barrier affecting health care Spanish translator used  3. Subchorionic hemorrhage of placenta in first trimester, single or unspecified fetus No further bleeding  4. Limited literacy  Preterm labor symptoms and general obstetric precautions including but not limited to vaginal bleeding, contractions, leaking of  fluid and fetal movement were reviewed in detail with the patient. Please refer to After Visit Summary for other counseling recommendations.   Return in about 4 weeks (around 04/10/2020) for low OB, in person.  Future Appointments  Date Time Provider Department Center  03/17/2020  7:45 AM [facility] US4 [facility]  04/10/2020  9:35 AM Marny Lowenstein, PA-C [facility]    Conan Bowens, MD

## 2020-03-17 ENCOUNTER — Ambulatory Visit: Payer: Self-pay | Attending: Obstetrics and Gynecology

## 2020-03-17 ENCOUNTER — Other Ambulatory Visit: Payer: Self-pay | Admitting: *Deleted

## 2020-03-17 ENCOUNTER — Other Ambulatory Visit: Payer: Self-pay

## 2020-03-17 DIAGNOSIS — Z363 Encounter for antenatal screening for malformations: Secondary | ICD-10-CM

## 2020-03-17 DIAGNOSIS — O418X10 Other specified disorders of amniotic fluid and membranes, first trimester, not applicable or unspecified: Secondary | ICD-10-CM | POA: Insufficient documentation

## 2020-03-17 DIAGNOSIS — Z3A18 18 weeks gestation of pregnancy: Secondary | ICD-10-CM

## 2020-03-17 DIAGNOSIS — Z3490 Encounter for supervision of normal pregnancy, unspecified, unspecified trimester: Secondary | ICD-10-CM | POA: Insufficient documentation

## 2020-03-17 DIAGNOSIS — Z362 Encounter for other antenatal screening follow-up: Secondary | ICD-10-CM

## 2020-03-17 DIAGNOSIS — O468X1 Other antepartum hemorrhage, first trimester: Secondary | ICD-10-CM | POA: Insufficient documentation

## 2020-03-21 LAB — AFP, SERUM, OPEN SPINA BIFIDA
AFP Value: 93.3 ng/mL
Gest. Age on Collection Date: 17.6 weeks
Maternal Age At EDD: 33.6 yr

## 2020-04-10 ENCOUNTER — Encounter: Payer: Self-pay | Admitting: Medical

## 2020-04-15 ENCOUNTER — Ambulatory Visit: Payer: Self-pay

## 2020-04-22 ENCOUNTER — Other Ambulatory Visit: Payer: Self-pay

## 2020-04-22 ENCOUNTER — Ambulatory Visit: Payer: Self-pay | Attending: Obstetrics and Gynecology

## 2020-04-22 DIAGNOSIS — Z362 Encounter for other antenatal screening follow-up: Secondary | ICD-10-CM | POA: Insufficient documentation

## 2020-04-22 DIAGNOSIS — Z3A23 23 weeks gestation of pregnancy: Secondary | ICD-10-CM

## 2020-04-25 ENCOUNTER — Ambulatory Visit (INDEPENDENT_AMBULATORY_CARE_PROVIDER_SITE_OTHER): Payer: Self-pay | Admitting: Medical

## 2020-04-25 ENCOUNTER — Other Ambulatory Visit: Payer: Self-pay

## 2020-04-25 ENCOUNTER — Encounter: Payer: Self-pay | Admitting: Medical

## 2020-04-25 VITALS — BP 100/66 | HR 80 | Wt 131.0 lb

## 2020-04-25 DIAGNOSIS — Z3492 Encounter for supervision of normal pregnancy, unspecified, second trimester: Secondary | ICD-10-CM

## 2020-04-25 DIAGNOSIS — Z603 Acculturation difficulty: Secondary | ICD-10-CM

## 2020-04-25 DIAGNOSIS — Z789 Other specified health status: Secondary | ICD-10-CM

## 2020-04-25 DIAGNOSIS — Z3A24 24 weeks gestation of pregnancy: Secondary | ICD-10-CM

## 2020-04-25 NOTE — Addendum Note (Signed)
Addended by: Kathee Delton on: 04/25/2020 09:46 AM   Modules accepted: Orders

## 2020-04-25 NOTE — Progress Notes (Signed)
   PRENATAL VISIT NOTE  Subjective:  Darlene Gallagher is a 34 y.o. [phone] at [redacted]w[redacted]d being seen today for ongoing prenatal care.  She is currently monitored for the following issues for this low-risk pregnancy and has Subchorionic hemorrhage; Language barrier affecting health care; Supervision of low-risk pregnancy; and Limited literacy on their problem list.  Patient reports occasional contractions.  Contractions: Irritability. Vag. Bleeding: None.  Movement: Present. Denies leaking of fluid.   The following portions of the patient's history were reviewed and updated as appropriate: allergies, current medications, past family history, past medical history, past social history, past surgical history and problem list.   Objective:   Vitals:   04/25/20 1054  BP: 100/66  Pulse: 80  Weight: 131 lb (59.4 kg)    Fetal Status: Fetal Heart Rate (bpm): 140 Fundal Height: 24 cm Movement: Present     General:  Alert, oriented and cooperative. Patient is in no acute distress.  Skin: Skin is warm and dry. No rash noted.   Cardiovascular: Normal heart rate noted  Respiratory: Normal respiratory effort, no problems with respiration noted  Abdomen: Soft, gravid, appropriate for gestational age.  Pain/Pressure: Present     Pelvic: Cervical exam deferred        Extremities: Normal range of motion.  Edema: None  Mental Status: Normal mood and affect. Normal behavior. Normal judgment and thought content.   Assessment and Plan:  Pregnancy: [id number] at [redacted]w[redacted]d 1. Encounter for supervision of low-risk pregnancy in second trimester - Doing well - [place] 7/13 reviewed, completed anatomy, no follow-up recommended  - Anticipatory guidance for next visit given including need for fasting GTT - Peds list given and paired down at patient's request to include only those close to her home that accept Medicaid   2. Language barrier affecting health care - interpreter present   3. [redacted] weeks gestation of  pregnancy  Preterm labor symptoms and general obstetric precautions including but not limited to vaginal bleeding, contractions, leaking of fluid and fetal movement were reviewed in detail with the patient. Please refer to After Visit Summary for other counseling recommendations.   Return in about 4 weeks (around 05/23/2020) for LOB, 28 week labs (fasting), In-Person.  Future Appointments  Date Time Provider Department Center  05/21/2020  8:20 AM [facility]  05/21/2020  8:55 AM Johnny Bridge, MD [facility]    Vonzella Nipple, PA-C

## 2020-04-25 NOTE — Patient Instructions (Addendum)
Contracciones de Braxton Hicks Braxton Hicks Contractions Las contracciones del tero pueden presentarse durante todo el embarazo, pero no siempre indican que la mujer est de parto. Es posible que usted haya tenido contracciones de prctica llamadas "contracciones de Braxton Hicks". A veces, se las confunde con el parto real. Qu son las contracciones de Braxton Hicks? Las contracciones de Braxton Hicks son espasmos que se producen en los msculos del tero antes del parto. A diferencia de las contracciones del parto verdadero, estas no producen el agrandamiento (la dilatacin) ni el afinamiento del cuello uterino. Hacia el final del embarazo (entre las semanas 32y34), las contracciones de Braxton Hicks pueden presentarse ms seguido y tornarse ms intensas. A veces, resulta difcil distinguirlas del parto verdadero porque pueden ser muy molestas. No debe sentirse avergonzada si concurre al hospital con falso parto. En ocasiones, la nica forma de saber si el trabajo de parto es verdadero es que el mdico determine si hay cambios en el cuello del tero. El mdico le har un examen fsico y quizs le controle las contracciones. Si usted no est de parto verdadero, el examen debe indicar que el cuello uterino no est dilatado y que usted no ha roto bolsa. Si no hay otros problemas de salud asociados con su embarazo, no habr inconvenientes si la envan a su casa con un falso parto. Es posible que las contracciones de Braxton Hicks continen hasta que se desencadene el parto verdadero. Cmo diferenciar el trabajo de parto falso del verdadero Trabajo de parto verdadero  Las contracciones duran de 30a70segundos.  Las contracciones pueden tornarse muy regulares.  La molestia generalmente se siente en la parte superior del tero y se extiende hacia la zona baja del abdomen y hacia la cintura.  Las contracciones no desaparecen cuando usted camina.  Las contracciones generalmente se hacen ms  intensas y aumentan en frecuencia.  El cuello uterino se dilata y se afina. Parto falso  En general, las contracciones son ms cortas y no tan intensas como las del parto verdadero.  En general, las contracciones son irregulares.  A menudo, las contracciones se sienten en la parte delantera de la parte baja del abdomen y en la ingle.  Las contracciones pueden desaparecer cuando usted camina o cambia de posicin mientras est acostada.  Las contracciones se vuelven ms dbiles y su duracin es menor a medida que transcurre el tiempo.  En general, el cuello uterino no se dilata ni se afina. Siga estas indicaciones en su casa:   Tome los medicamentos de venta libre y los recetados solamente como se lo haya indicado el mdico.  Contine haciendo los ejercicios habituales y siga las dems indicaciones que el mdico le d.  Coma y beba con moderacin si cree que est de parto.  Si las contracciones de Braxton Hicks le provocan incomodidad: ? Cambie de posicin: si est acostada o descansando, camine; si est caminando, descanse. ? Sintese y descanse en una baera con agua tibia. ? Beba suficiente lquido como para mantener la orina de color amarillo plido. La deshidratacin puede provocar contracciones. ? Respire lenta y profundamente varias veces por hora.  Vaya a todas las visitas de control prenatales y de control como se lo haya indicado el mdico. Esto es importante. Comunquese con un mdico si:  Tiene fiebre.  Siente dolor constante en el abdomen. Solicite ayuda de inmediato si:  Las contracciones se intensifican, se hacen ms regulares y cercanas entre s.  Tiene una prdida de lquido por la vagina.  Elimina   una mucosidad sanguinolenta (prdida del tapn mucoso).  Tiene una hemorragia vaginal.  Tiene un dolor en la zona lumbar que nunca tuvo antes.  Siente que la cabeza del beb empuja hacia abajo y ejerce presin en la zona plvica.  El beb no se mueve tanto  como antes. Resumen  Las [employer] se presentan antes del parto se conocen como contracciones de [place] parto o contracciones de Multimedia programmer.  En general, las contracciones de [address] son ms cortas, ms dbiles, con ms tiempo entre una y [place], y menos regulares que las contracciones del parto verdadero. Las contracciones del parto verdadero se intensifican progresivamente y se tornan regulares y ms frecuentes.  Para controlar la [employer] producen las contracciones de [place], puede cambiar de posicin, darse un bao templado y Lawyer, beber mucha agua o practicar la respiracin profunda. Esta informacin no tiene Theme park manager el consejo del mdico. Asegrese de hacerle al mdico cualquier pregunta que tenga. Document Revised: 01/06/2018 Document Reviewed: 05/09/2017 Elsevier Patient Education  2020 Elsevier Inc.  AREA PEDIATRIC/FAMILY PRACTICE PHYSICIANS  [place] ([id number]) . [facility] Melodie Bouillon, MD; Lum Babe, MD; Sheffield Slider, MD; Leveda Anna, MD; McDiarmid, MD; Jerene Bears, MD; Jennette Kettle, MD; Gwendolyn Grant, MD o [address] [place] [place], [place] [address] o [phone] o Mon-Fri 8:30-12:30, 1:30-5:00 o Providers come to see babies at [facility] o Accepting Medicaid . [facility] Fatima Sanger, MD o [address] [place], [place] [address] o [phone] o Mon, Tue, Thur, Fri 8:30-5:00, Wed 10:00-7:00 (closed 1-2pm) o Babies seen by [facility] providers o Accepting Medicaid . Donnie Coffin - Pediatrician Fae Pippin, MD o [address], [place], [place] [address] o [phone] o Mon-Fri 8:30-5:00, Sat 8:30-12:00 o Provider comes to see babies at [facility] o Accepting Medicaid o Must have been referred from current patients or contacted office prior to delivery . [facility]) Leotis Pain, MD; Ave Filter, MD;  Luna Fuse, MD; Kennedy Bucker, MD; Konrad Dolores, MD; Kathlene November, MD; Jenne Campus, MD; Lubertha South, MD; Wynetta Emery, MD; Duffy Rhody, MD; Gerre Couch, NP; Shirl Harris, NP o [address] [place], [place], [place] [address] o [phone] o Mon, Tue, Thur, Fri 8:30-5:30, Wed 9:30-5:30, Sat 8:30-12:30 o Babies seen by [facility] providers o Accepting Medicaid o Only accepting infants of first-time parents or siblings of current patients [facility] discharge coordinator will make follow-up appointment  [place] [phone]) . [facility] Jorge Mandril, MD; Alita Chyle, MD; Princella Ion, MD; MD; Earlene Plater, MD; Jamesetta Orleans, MD; Alvera Novel, MD; Clarene Duke, MD; Rana Snare, MD; Carmon Ginsberg, MD; Alinda Money, MD; Hosie Poisson, MD; Mayford Knife, MD o [address] [place], [place] [address] o [phone] o Mon-Fri 8:30-5:00 (extended evenings Mon-Thur as needed), Sat-Sun 10:00-1:00 o Providers come to see babies at [facility] o Accepting Medicaid for families of first-time babies and families with all children in the household age 17 and under. Must register with office prior to making appointment (M-F only). . [facility])  Suzette Battiest, MD; Zachery Dauer, MD; Stefan Church, MD; Sabino Dick, MD; Quitman Livings, MD; Farris Has, MD; Gaynell Face, MD; Betha Loa, MD; Colon Flattery, MD; Clifton James, MD o [address] [place] [place], [place] [address] o [phone] o Mon-Fri 8:30-5:30, Sat (Oct.-Mar.) 9:00-1:00 o Babies seen by providers at [facility] o Accepting Medicaid

## 2020-05-08 ENCOUNTER — Other Ambulatory Visit: Payer: Self-pay

## 2020-05-08 ENCOUNTER — Inpatient Hospital Stay (HOSPITAL_COMMUNITY)
Admission: AD | Admit: 2020-05-08 | Discharge: 2020-05-08 | Disposition: A | Discharge status: Home / Self Care | Payer: Self-pay | Attending: Obstetrics and Gynecology | Admitting: Obstetrics and Gynecology

## 2020-05-08 ENCOUNTER — Encounter (HOSPITAL_COMMUNITY): Payer: Self-pay | Admitting: Obstetrics and Gynecology

## 2020-05-08 DIAGNOSIS — R82998 Other abnormal findings in urine: Secondary | ICD-10-CM

## 2020-05-08 DIAGNOSIS — Z3A26 26 weeks gestation of pregnancy: Secondary | ICD-10-CM | POA: Insufficient documentation

## 2020-05-08 DIAGNOSIS — N858 Other specified noninflammatory disorders of uterus: Secondary | ICD-10-CM

## 2020-05-08 DIAGNOSIS — B3731 Acute candidiasis of vulva and vagina: Secondary | ICD-10-CM

## 2020-05-08 DIAGNOSIS — N898 Other specified noninflammatory disorders of vagina: Secondary | ICD-10-CM

## 2020-05-08 DIAGNOSIS — O26892 Other specified pregnancy related conditions, second trimester: Secondary | ICD-10-CM

## 2020-05-08 DIAGNOSIS — Z550 Illiteracy and low-level literacy: Secondary | ICD-10-CM

## 2020-05-08 DIAGNOSIS — B373 Candidiasis of vulva and vagina: Secondary | ICD-10-CM | POA: Insufficient documentation

## 2020-05-08 DIAGNOSIS — O98812 Other maternal infectious and parasitic diseases complicating pregnancy, second trimester: Secondary | ICD-10-CM | POA: Insufficient documentation

## 2020-05-08 LAB — URINALYSIS, ROUTINE W REFLEX MICROSCOPIC
Bilirubin Urine: NEGATIVE
Glucose, UA: NEGATIVE mg/dL
Hgb urine dipstick: NEGATIVE
Ketones, ur: NEGATIVE mg/dL
Nitrite: NEGATIVE
Protein, ur: 30 mg/dL — AB
Specific Gravity, Urine: 1.031 — ABNORMAL HIGH (ref 1.005–1.030)
WBC, UA: >50 WBC/hpf — ABNORMAL HIGH (ref 0–5)
pH: 5 (ref 5.0–8.0)

## 2020-05-08 LAB — POCT FERN TEST: POCT Fern Test: NEGATIVE

## 2020-05-08 LAB — WET PREP, GENITAL
Sperm: NONE SEEN
Trich, Wet Prep: NONE SEEN

## 2020-05-08 LAB — FETAL FIBRONECTIN: Fetal Fibronectin: NEGATIVE

## 2020-05-08 MED ORDER — TERCONAZOLE 0.4 % VA CREA: 1.0000 | TOPICAL_CREAM | Freq: Every day | VAGINAL | 0 refills | Status: DC

## 2020-05-08 MED ORDER — NIFEDIPINE 10 MG PO CAPS
10.0000 mg | ORAL_CAPSULE | ORAL | Status: DC | PRN
  Administered 2020-05-08 (×2): 10 mg via ORAL
  Filled 2020-05-08 (×2): qty 1

## 2020-05-08 MED ORDER — CEPHALEXIN 500 MG PO CAPS
500.0000 mg | ORAL_CAPSULE | Freq: Four times a day (QID) | ORAL | 0 refills | Status: DC
Start: 2020-05-08 — End: 2020-08-13
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
## 2020-05-08 NOTE — Discharge Instructions (Signed)
Infeccin mictica vaginal en los adultos Vaginal Yeast Infection, Adult  La infeccin mictica vaginal es una afeccin que causa secrecin vaginal y tambin dolor, hinchazn y enrojecimiento (inflamacin) de la vagina. Esta es una afeccin frecuente. Algunas mujeres contraen esta infeccin con frecuencia. Cules son las causas? La causa de la infeccin es un cambio en el equilibrio normal de los hongos (cndida) y las bacterias que viven en la vagina. Esta alteracin deriva en el crecimiento excesivo de los hongos, lo que causa la inflamacin. Qu incrementa el riesgo? La afeccin es ms probable en las mujeres que tienen estas caractersticas:  Toman antibiticos.  Tienen diabetes.  Toman anticonceptivos orales.  Estn embarazadas.  Se hacen duchas vaginales con frecuencia.  Tienen debilitado el sistema de defensa del organismo (sistema inmunitario).  Han estado tomando medicamentos con corticoesteroides durante mucho tiempo.  Usan ropa ajustada con frecuencia. Cules son los signos o sntomas? Los sntomas de esta afeccin incluyen:  Secrecin vaginal blanca, cremosa y espesa.  Hinchazn, picazn, enrojecimiento e irritacin de la vagina. Los labios de la vagina (vulva) tambin se pueden infectar.  Dolor o ardor al orinar.  Dolor durante las relaciones sexuales. Cmo se diagnostica? Esta afeccin se diagnostica en funcin de lo siguiente:  Sus antecedentes mdicos.  Un examen fsico.  Un examen plvico. El mdico examinar una muestra de la secrecin vaginal con un microscopio. Probablemente el mdico enve esta muestra al laboratorio para analizarla y confirmar el diagnstico. Cmo se trata? Esta afeccin se trata con medicamentos. Los medicamentos pueden ser recetados o de venta libre. Podrn indicarle que use uno o ms de lo siguiente:  Medicamentos que se toman por boca (orales).  Medicamentos que se aplican como una crema (tpicos).  Medicamentos que se  colocan directamente en la vagina (vulos vaginales). Siga estas instrucciones en su casa:  Estilo de vida  No tenga relaciones sexuales hasta que el mdico lo autorice. Comunique a su compaero sexual que tiene una infeccin por hongos. Esa persona debera visitar a su mdico y preguntarle si debera recibir tratamiento tambin.  No use ropa ajustada, como pantimedias o pantalones ajustados.  Use ropa interior de algodn, que permite el paso del aire. Instrucciones generales  Tome o aplquese los medicamentos de venta libre y los recetados solamente como se lo haya indicado el mdico.  Consuma ms yogur. Esto puede ayudar a evitar la recurrencia de la infeccin mictica.  No use tampones hasta que el mdico la autorice.  Intente darse un bao de asiento para aliviar las molestias. Se trata de un bao de agua tibia que se toma mientras se est sentada. El agua solo debe llegar hasta las caderas y cubrir las nalgas. Hgalo 3o 4veces al da o como se lo haya indicado el mdico.  No se haga duchas vaginales.  Si tiene diabetes, mantenga bajo control los niveles de azcar en la sangre.  Concurra a todas las visitas de seguimiento como se lo haya indicado el mdico. Esto es importante. Comunquese con un mdico si:  Tiene fiebre.  Los sntomas desaparecen y luego vuelven a aparecer.  Los sntomas no mejoran con el tratamiento.  Sus sntomas empeoran.  Aparecen nuevos sntomas.  Aparecen ampollas alrededor o adentro de la vagina.  Le sale sangre de la vagina y no est menstruando.  Siente dolor en el abdomen. Resumen  La infeccin mictica vaginal es una afeccin que causa secrecin y tambin dolor, hinchazn y enrojecimiento (inflamacin) de la vagina.  Esta afeccin se trata con medicamentos. Los   medicamentos pueden ser recetados o de [address].  Tome o aplquese los medicamentos de venta libre y los recetados solamente como se lo haya indicado el mdico.  No se haga  duchas vaginales. No tenga relaciones sexuales ni use tampones hasta que el mdico la autorice.  Comunquese con un mdico si los sntomas no mejoran con el tratamiento o si los sntomas desaparecen y [employer]. Esta informacin no tiene Theme park manager el consejo del mdico. Asegrese de hacerle al mdico cualquier pregunta que tenga. Document Revised: 06/12/2019 Document Reviewed: 06/12/2019 Elsevier Patient Education  2020 Elsevier Inc.  Vanetta Mulders e infeccin urinaria Pregnancy and Urinary Tract Infection  Una infeccin urinaria (IU) puede ocurrir en Corporate treasurer de las vas urinarias. Estas incluyen los riones, los tubos que [employer] riones con la vejiga (urteres), la vejiga y el tubo por el que se elimina la orina del cuerpo (uretra). Estos rganos fabrican, Barrister's clerk y eliminan la orina del organismo. El mdico puede usar otras palabras para describir la infeccin. La IU alta afecta los urteres y a los riones (pielonefritis). La IU baja afecta la vejiga (cistitis) y Engineer, mining (uretritis). La mayora de las infecciones de las vas urinarias es causada por la presencia de bacterias en la zona genital, alrededor de la entrada de las vas urinarias (uretra). Estas bacterias proliferan y causan irritacin e inflamacin de las vas [place]. Usted tiene ms probabilidades de tener una IU durante el embarazo porque los cambios fsicos y hormonales por los que atraviesa el cuerpo pueden hacer que sea ms fcil que las bacterias ingresen en las vas urinarias. El beb en gestacin tambin hace presin sobre la vejiga y puede afectar el flujo de [place]. Es Public librarian y tratar las IU [employer] embarazo debido al riesgo de complicaciones graves tanto para usted como para el beb. Jill Alexanders modo me afecta? Entre los sntomas de una IU, se incluyen los siguientes:  Necesidad inmediata (urgente) de Geographical information systems officer.  Miccin frecuente o eliminacin de pequeas cantidades de orina con  frecuencia.  Ardor o dolor al [employer].  Presencia de [employer].  Orina con mal olor u [employer] atpico.  Dificultad para Geographical information systems officer.  Mason Jim turbia.  Dolor en el abdomen o en la parte inferior de la espalda.  Secrecin vaginal. Tambin puede presentar:  Vmitos o disminucin del apetito.  Confusin.  Irritabilidad o cansancio.  Grant Ruts.  Diarrea. Cmo afecta esto al beb? Una IU no tratada durante el embarazo podra ocasionar una infeccin renal o una infeccin generalizada, lo que puede causar problemas de salud que afecten al beb. Algunas de las complicaciones posibles de una IU no tratada son las siguientes:  Dar a luz al beb antes de las 37semanas de Psychiatrist (prematuro).  Tener un beb con bajo peso al nacer.  Presentar presin arterial alta durante el embarazo (preeclampsia).  Tener un nivel bajo de hemoglobina (anemia). Qu puedo hacer para disminuir el riesgo? Para prevenir la IU, haga lo siguiente:  Vaya al bao en cuanto sienta la necesidad de [place]. No contenga la orina durante largos perodos de [place].  Lmpiese siempre desde adelante hacia atrs, en especial despus de defecar. Use cada trozo de papel higinico solo una vez cuando se limpie.  Vace la vejiga despus de Management consultant.  Mantenga la zona genital seca.  Albesa Seen entre 6 y 10vasos de agua por Futures trader.  No se haga duchas vaginales ni use desodorantes en aerosol. Cmo se trata? El tratamiento de esta afeccin puede incluir  lo siguiente:  Antibiticos cuyo uso es seguro durante el [place].  Otros medicamentos para tratar causas menos frecuentes de IU. Siga estas instrucciones en su casa:  Si le recetaron un antibitico, tmelo como se lo haya indicado el mdico. No deje de usar el antibitico aunque comience a Actor.  Concurra a todas las visitas de [address] se lo haya indicado el mdico. Esto es importante. Comunquese con un mdico si:  Los sntomas  no mejoran o empeoran.  Tiene una secrecin vaginal anormal. Solicite ayuda inmediatamente si:  Lance Muss.  Tiene nuseas y vmitos.  Siente dolor en la espalda o en el costado del cuerpo.  Siente contracciones en el tero.  Siente dolor en la parte inferior del abdomen.  Tiene una prdida de lquido abundante por la vagina.  Observa sangre en la orina. Resumen  Una infeccin urinaria (IU) es una infeccin en cualquier parte de las vas urinarias, que [employer] riones, los urteres, la vejiga y Engineer, mining.  La mayora de las infecciones de las vas urinarias es causada por la presencia de bacterias en la zona genital, alrededor de la entrada de las vas urinarias (uretra).  Es ms probable presentar una IU [employer].  Si le recetaron un antibitico, tmelo como se lo haya indicado el mdico. No deje de usar el antibitico aunque comience a Actor. Esta informacin no tiene Theme park manager el consejo del mdico. Asegrese de hacerle al mdico cualquier pregunta que tenga. Document Revised: 10/16/2018 Document Reviewed: 10/16/2018 Elsevier Patient Education  2020 [employer].

## 2020-05-19 ENCOUNTER — Other Ambulatory Visit: Payer: Self-pay | Admitting: *Deleted

## 2020-05-19 DIAGNOSIS — Z3490 Encounter for supervision of normal pregnancy, unspecified, unspecified trimester: Secondary | ICD-10-CM

## 2020-05-21 ENCOUNTER — Encounter: Payer: Self-pay | Admitting: Obstetrics & Gynecology

## 2020-05-21 ENCOUNTER — Other Ambulatory Visit: Payer: Self-pay

## 2020-05-21 ENCOUNTER — Telehealth: Payer: Self-pay | Admitting: Obstetrics & Gynecology

## 2020-05-21 NOTE — Telephone Encounter (Signed)
Attempted to reach patient about her missed appointment. Was not able to leave a message due to her voicemail not being set up.

## 2020-07-22 ENCOUNTER — Encounter: Payer: Self-pay | Admitting: Advanced Practice Midwife

## 2020-07-22 ENCOUNTER — Encounter: Payer: Self-pay | Admitting: Student

## 2020-07-22 ENCOUNTER — Other Ambulatory Visit (HOSPITAL_COMMUNITY)
Admission: RE | Admit: 2020-07-22 | Discharge: 2020-07-22 | Disposition: A | Discharge status: Home / Self Care | Payer: Self-pay | Source: Ambulatory Visit | Attending: Advanced Practice Midwife | Admitting: Advanced Practice Midwife

## 2020-07-22 ENCOUNTER — Other Ambulatory Visit: Payer: Self-pay | Admitting: Advanced Practice Midwife

## 2020-07-22 ENCOUNTER — Ambulatory Visit (INDEPENDENT_AMBULATORY_CARE_PROVIDER_SITE_OTHER): Payer: Self-pay | Admitting: Advanced Practice Midwife

## 2020-07-22 ENCOUNTER — Other Ambulatory Visit: Payer: Self-pay

## 2020-07-22 VITALS — BP 115/75 | HR 90 | Wt 138.4 lb

## 2020-07-22 DIAGNOSIS — Z3403 Encounter for supervision of normal first pregnancy, third trimester: Secondary | ICD-10-CM | POA: Insufficient documentation

## 2020-07-22 DIAGNOSIS — Z3A36 36 weeks gestation of pregnancy: Secondary | ICD-10-CM | POA: Insufficient documentation

## 2020-07-22 DIAGNOSIS — O0933 Supervision of pregnancy with insufficient antenatal care, third trimester: Secondary | ICD-10-CM

## 2020-07-22 DIAGNOSIS — Z3493 Encounter for supervision of normal pregnancy, unspecified, third trimester: Secondary | ICD-10-CM

## 2020-07-22 NOTE — Progress Notes (Signed)
   PRENATAL VISIT NOTE  Subjective:  Darlene Gallagher is a 34 y.o. [phone] at [redacted]w[redacted]d being seen today for ongoing prenatal care.  She is currently monitored for the following issues for this low-risk pregnancy and has Subchorionic hemorrhage; Language barrier affecting health care; Supervision of low-risk pregnancy; and Limited literacy on their problem list.  Patient reports no complaints.  Contractions: Irritability. Vag. Bleeding: None.  Movement: Present. Denies leaking of fluid.   The following portions of the patient's history were reviewed and updated as appropriate: allergies, current medications, past family history, past medical history, past social history, past surgical history and problem list.   Objective:   Vitals:   07/22/20 1307  BP: 115/75  Pulse: 90  Weight: 138 lb 6.4 oz (62.8 kg)    Fetal Status: Fetal Heart Rate (bpm): 145 Fundal Height: 36 cm Movement: Present  Presentation: Vertex  General:  Alert, oriented and cooperative. Patient is in no acute distress.  Skin: Skin is warm and dry. No rash noted.   Cardiovascular: Normal heart rate noted  Respiratory: Normal respiratory effort, no problems with respiration noted  Abdomen: Soft, gravid, appropriate for gestational age.  Pain/Pressure: Present     Pelvic: Cervical exam performed in the presence of a chaperone Dilation: Closed Effacement (%): 70 Station: -1  Extremities: Normal range of motion.  Edema: None  Mental Status: Normal mood and affect. Normal behavior. Normal judgment and thought content.   Assessment and Plan:  Pregnancy: [id number] at [redacted]w[redacted]d 1. Encounter for supervision of normal first pregnancy in third trimester - HgB A1c - Glucose Tolerance, 1 Hour - Hepatitis C Antibody - Culture, beta strep (group b only) - Cervicovaginal ancillary only( [place]) - HIV - RPR   2. Limited prenatal care in third trimester - Patient was seen at 14 weeks, 18 weeks and 24 weeks. Last visit was at  24 weeks on 04/25/2020. Has not been seen since.  - Due to limited visits will get 1 hour GTT today  - patient states that she has missed visits because she had to work because once she has the baby she won't be able to work for some time. She also has a 33 year old and 33 year old in [place] and she has been having to send them money.  - HgB A1c - Glucose Tolerance, 1 Hour  3. [redacted] weeks gestation of pregnancy - Culture, beta strep (group b only) - Cervicovaginal ancillary only( [place])  Term labor symptoms and general obstetric precautions including but not limited to vaginal bleeding, contractions, leaking of fluid and fetal movement were reviewed in detail with the patient. Please refer to After Visit Summary for other counseling recommendations.   Return in about 1 week (around 07/29/2020).  Future Appointments  Date Time Provider Department Center  07/29/2020  1:15 PM Rasch, Harolyn Rutherford, NP [facility]  08/05/2020  2:35 PM Marylene Land, CNM [facility]  08/13/2020 10:55 AM Armando Reichert, CNM [facility]    Thressa Sheller DNP, CNM  07/22/20  1:39 PM

## 2020-07-23 LAB — RPR: RPR Ser Ql: NONREACTIVE

## 2020-07-23 LAB — CERVICOVAGINAL ANCILLARY ONLY
Chlamydia: NEGATIVE
Comment: NEGATIVE
Comment: NORMAL
Neisseria Gonorrhea: NEGATIVE

## 2020-07-23 LAB — HEMOGLOBIN A1C
Est. average glucose Bld gHb Est-mCnc: 114 mg/dL
Hgb A1c MFr Bld: 5.6 % (ref 4.8–5.6)

## 2020-07-23 LAB — HIV ANTIBODY (ROUTINE TESTING W REFLEX): HIV Screen 4th Generation wRfx: NONREACTIVE

## 2020-07-23 LAB — GLUCOSE TOLERANCE, 1 HOUR: Glucose, 1Hr PP: 99 mg/dL (ref 65–199)

## 2020-07-23 LAB — HEPATITIS C ANTIBODY: Hep C Virus Ab: <0.1 s/co ratio (ref 0.0–0.9)

## 2020-07-26 LAB — CULTURE, BETA STREP (GROUP B ONLY): Strep Gp B Culture: NEGATIVE

## 2020-07-29 ENCOUNTER — Ambulatory Visit (INDEPENDENT_AMBULATORY_CARE_PROVIDER_SITE_OTHER): Payer: Self-pay | Admitting: Obstetrics and Gynecology

## 2020-07-29 ENCOUNTER — Other Ambulatory Visit: Payer: Self-pay

## 2020-07-29 ENCOUNTER — Encounter: Payer: Self-pay | Admitting: Obstetrics and Gynecology

## 2020-07-29 VITALS — BP 108/67 | HR 84 | Wt 140.7 lb

## 2020-07-29 DIAGNOSIS — Z3493 Encounter for supervision of normal pregnancy, unspecified, third trimester: Secondary | ICD-10-CM

## 2020-07-29 NOTE — Patient Instructions (Signed)
https://www.cdc.gov/vaccines/hcp/vis/vis-statements/tdap.pdf">  °Vacuna Tdap (tétanos, difteria y tos ferina): lo que debe saber °Tdap (Tetanus, Diphtheria, Pertussis) Vaccine: What You Need to Know °1. ¿Por qué vacunarse? °La vacuna Tdap puede prevenir el tétanos, la difteria y la tos ferina. °La difteria y la tos ferina se contagian de persona a persona. El tétanos ingresa al organismo a través de cortes o heridas. °· El TÉTANOS (T) provoca rigidez dolorosa en los músculos. El tétanos puede causar graves problemas de salud, como no poder abrir la boca, tener dificultad para tragar y respirar, o la muerte. °· La DIFTERIA (D) puede causar dificultad para respirar, insuficiencia cardíaca, parálisis o muerte. °· La TOS FERINA (aP) también conocida como “tos convulsa” puede causar tos violenta e incontrolable lo que hace difícil respirar, comer o beber. La tos ferina puede ser muy grave en los bebés y en los niños pequeños, y causar neumonía, convulsiones, daño cerebral o la muerte. En adolescentes y adultos, puede causar pérdida de peso, pérdida del control de la vejiga, desmayos y fracturas de costillas al toser de manera intensa. °2. Vacuna Tdap °La vacuna Tdap es solo para niños de 7 años en adelante, adolescentes y adultos.  °Los adolescentes debe recibir una dosis única de la vacuna Tdap, preferentemente a los 11 o 12 años. °Las mujeres embarazadas deben recibir una dosis de la vacuna Tdap en cada embarazo para proteger al recién nacido de la tos ferina. Los bebés tienen mayor riesgo de sufrir complicaciones graves y potencialmente mortales debido a la tos ferina. °Los adultos que nunca recibieron la vacuna Tdap deben recibir una dosis. °Además, los adultos deben recibir una dosis de refuerzo cada 10 años, o antes si la persona sufre una quemadura o una herida grave y sucia. Las dosis de refuerzo pueden ser de la vacuna Tdap o la Td (una vacuna diferente que protege contra el tétanos y la difteria, pero no contra  la tos ferina). °La vacuna Tdap puede ser administrada al mismo tiempo que otras vacunas. °3. Hable con el médico °Comuníquese con la persona que le coloca las vacunas si la persona que la recibe: °· Ha tenido una reacción alérgica después de una dosis anterior de cualquier vacuna contra el tétanos, la difteria o la tos ferina, o cualquier alergia grave, potencialmente mortal. °· Ha tenido un coma, disminución del nivel de la conciencia o convulsiones prolongadas dentro de los 7 días posteriores a una dosis anterior de cualquier vacuna contra la tos ferina (DTP, DTaP o Tdap). °· Tiene convulsiones u otro problema del sistema nervioso. °· Alguna vez tuvo síndrome de Guillain-Barré (también llamado SGB). °· Ha tenido dolor intenso o hinchazón después de una dosis anterior de cualquier vacuna contra el tétanos o la difteria. °En algunos casos, es posible que el médico decida posponer la aplicación de la vacuna Tdap para una visita en el futuro.  °Las personas que sufren trastornos menores, como un resfrío, pueden vacunarse. Las personas que tienen enfermedades moderadas o graves generalmente deben esperar hasta recuperarse para poder recibir la vacuna Tdap.  °Su médico puede darle más información. °4. Riesgos de una reacción a la vacuna °· Después de recibir la vacuna Tdap a veces se puede tener dolor, enrojecimiento o hinchazón en el lugar donde se aplicó la inyección, fiebre leve, dolor de cabeza, sensación de cansancio y náuseas, vómitos, diarrea o dolor de estómago. °Las personas a veces se desmayan después de procedimientos médicos, incluida la vacunación. Informe al médico si se siente mareado, tiene cambios en la visión o zumbidos   en los oídos.  °Al igual que con cualquier medicamento, existe una probabilidad muy remota de que una vacuna cause una reacción alérgica grave, otra lesión grave o la muerte. °5. ¿Qué pasa si se presenta un problema grave? °Podría producirse una reacción alérgica después de que la  persona vacunada abandone la clínica. Si observa signos de una reacción alérgica grave (ronchas, hinchazón de la cara y la garganta, dificultad para respirar, latidos cardíacos acelerados, mareos o debilidad), llame al 9-1-1 y lleve a la persona al hospital más cercano. °Si se presentan otros signos que le preocupan, comuníquese con su médico.  °Las reacciones adversas deben informarse al Sistema de Informe de Eventos Adversos de Vacunas (Vaccine Adverse Event Reporting System, VAERS). Por lo general, el médico presenta este informe o puede hacerlo usted mismo. Visite el sitio web del VAERS en www.vaers.hhs.gov o llame al 1-800-822-7967. El VAERS es solo para informar reacciones; su personal no proporciona asesoramiento médico. °6. Programa Nacional de Compensación de Daños por Vacunas °El Programa Nacional de Compensación de Daños por Vacunas (National Vaccine Injury Compensation Program, VICP) es un programa federal que fue creado para compensar a las personas que puedan haber sufrido daños al recibir ciertas vacunas. Visite el sitio web del VICP en www.hrsa.gov/vaccinecompensation o llame al 1-800-338-2382 para obtener más información acerca del programa y de cómo presentar un reclamo. Hay un límite de tiempo para presentar un reclamo de compensación. °7. ¿Cómo puedo obtener más información? °· Pregúntele a su médico. °· Comuníquese con el servicio de salud de su localidad o su estado. °· Comuníquese con los Centers for Disease Control and Prevention, CDC (Centros para el Control y la Prevención de Enfermedades): °? Llame al 1-800-232-4636 (1-800-CDC-INFO) o °? Visite el sitio web de los CDC en www.cdc.gov/vaccines °Declaración de información de la vacuna Tdap (tétanos, difteria y tos ferina) (10/14/2018) °Esta información no tiene como fin reemplazar el consejo del médico. Asegúrese de hacerle al médico cualquier pregunta que tenga. °Document Revised: 02/06/2019 Document Reviewed: 02/06/2019 °Elsevier Patient  Education © 2020 Elsevier Inc. ° ° °

## 2020-07-29 NOTE — Progress Notes (Signed)
   PRENATAL VISIT NOTE  Subjective:  Darlene Gallagher is a 34 y.o. [phone] at [redacted]w[redacted]d being seen today for ongoing prenatal care.  She is currently monitored for the following issues for this low-risk pregnancy and has Subchorionic hemorrhage; Language barrier affecting health care; Supervision of low-risk pregnancy; and Limited literacy on their problem list.  Patient reports no complaints.  Contractions: Irritability. Vag. Bleeding: None.  Movement: Present. Denies leaking of fluid.   The following portions of the patient's history were reviewed and updated as appropriate: allergies, current medications, past family history, past medical history, past social history, past surgical history and problem list.   Objective:   Vitals:   07/29/20 1332  BP: 108/67  Pulse: 84  Weight: 140 lb 11.2 oz (63.8 kg)    Fetal Status: Fetal Heart Rate (bpm): 154 Fundal Height: 37 cm Movement: Present     General:  Alert, oriented and cooperative. Patient is in no acute distress.  Skin: Skin is warm and dry. No rash noted.   Cardiovascular: Normal heart rate noted  Respiratory: Normal respiratory effort, no problems with respiration noted  Abdomen: Soft, gravid, appropriate for gestational age.  Pain/Pressure: Present     Pelvic: Cervical exam deferred        Extremities: Normal range of motion.  Edema: None  Mental Status: Normal mood and affect. Normal behavior. Normal judgment and thought content.   Assessment and Plan:  Pregnancy: [id number] at [redacted]w[redacted]d 1. Encounter for supervision of low-risk pregnancy in third trimester  - Tdap vaccine greater than or equal to 7yo IM- patient refused.   Term labor symptoms and general obstetric precautions including but not limited to vaginal bleeding, contractions, leaking of fluid and fetal movement were reviewed in detail with the patient. Please refer to After Visit Summary for other counseling recommendations.   No follow-ups on file.  Future  Appointments  Date Time Provider Department Center  08/05/2020  2:35 PM Madlyn Frankel [facility]  08/13/2020 10:55 AM Armando Reichert, CNM [facility]    Venia Carbon, NP

## 2020-08-05 ENCOUNTER — Encounter: Payer: Self-pay | Admitting: Student

## 2020-08-13 ENCOUNTER — Other Ambulatory Visit: Payer: Self-pay | Admitting: Advanced Practice Midwife

## 2020-08-13 ENCOUNTER — Encounter: Payer: Self-pay | Admitting: Advanced Practice Midwife

## 2020-08-13 ENCOUNTER — Other Ambulatory Visit: Payer: Self-pay

## 2020-08-13 ENCOUNTER — Ambulatory Visit (INDEPENDENT_AMBULATORY_CARE_PROVIDER_SITE_OTHER): Payer: Self-pay | Admitting: Advanced Practice Midwife

## 2020-08-13 VITALS — BP 110/81 | HR 71 | Wt 143.5 lb

## 2020-08-13 DIAGNOSIS — Z3480 Encounter for supervision of other normal pregnancy, unspecified trimester: Secondary | ICD-10-CM

## 2020-08-13 DIAGNOSIS — Z3A40 40 weeks gestation of pregnancy: Secondary | ICD-10-CM

## 2020-08-13 NOTE — Progress Notes (Signed)
   PRENATAL VISIT NOTE  Subjective:  Darlene Gallagher is a 34 y.o. [phone] at [redacted]w[redacted]d being seen today for ongoing prenatal care.  She is currently monitored for the following issues for this low-risk pregnancy and has Subchorionic hemorrhage; Language barrier affecting health care; Supervision of low-risk pregnancy; and Limited literacy on their problem list.  Patient reports no complaints.  Contractions: Irritability. Vag. Bleeding: None.  Movement: Present. Denies leaking of fluid.   The following portions of the patient's history were reviewed and updated as appropriate: allergies, current medications, past family history, past medical history, past social history, past surgical history and problem list.   Objective:   Vitals:   08/13/20 1045  BP: 110/81  Pulse: 71  Weight: 143 lb 8 oz (65.1 kg)    Fetal Status: Fetal Heart Rate (bpm): 143   Movement: Present     General:  Alert, oriented and cooperative. Patient is in no acute distress.  Skin: Skin is warm and dry. No rash noted.   Cardiovascular: Normal heart rate noted  Respiratory: Normal respiratory effort, no problems with respiration noted  Abdomen: Soft, gravid, appropriate for gestational age.  Pain/Pressure: Present     Pelvic: Cervical exam performed in the presence of a chaperone        Extremities: Normal range of motion.  Edema: None  Mental Status: Normal mood and affect. Normal behavior. Normal judgment and thought content.    NST:  Baseline: 145 Variability: moderate Accels: 15x15 Decels: none Toco: irritability  Reactive   Assessment and Plan:  Pregnancy: [id number] at [redacted]w[redacted]d 1. Supervision of other normal pregnancy, antepartum   2. [redacted] weeks gestation of pregnancy - BPP/NST next week - IOL scheduled for 08/20/2020 - IOL orders placed   Term labor symptoms and general obstetric precautions including but not limited to vaginal bleeding, contractions, leaking of fluid and fetal movement were  reviewed in detail with the patient. Please refer to After Visit Summary for other counseling recommendations.   No follow-ups on file.  Future Appointments  Date Time Provider Department Center  08/18/2020  8:15 AM [facility] NST [facility]  08/20/2020  8:50 AM [facility] SCHED ROOM [facility] None    Thressa Sheller DNP, CNM  08/13/20  12:14 PM

## 2020-08-13 NOTE — Progress Notes (Signed)
Video Interpreter # [phone]

## 2020-08-14 ENCOUNTER — Encounter (HOSPITAL_COMMUNITY): Payer: Self-pay | Admitting: *Deleted

## 2020-08-14 ENCOUNTER — Telehealth (HOSPITAL_COMMUNITY): Payer: Self-pay | Admitting: *Deleted

## 2020-08-14 NOTE — Telephone Encounter (Signed)
Preadmission screen Interpreter number [phone]

## 2020-08-17 ENCOUNTER — Inpatient Hospital Stay (HOSPITAL_COMMUNITY)
Admission: AD | Admit: 2020-08-17 | Discharge: 2020-08-19 | DRG: 807 | Disposition: A | Discharge status: Home / Self Care | Payer: Medicaid Other | Attending: Obstetrics and Gynecology | Admitting: Obstetrics and Gynecology

## 2020-08-17 ENCOUNTER — Other Ambulatory Visit: Payer: Self-pay

## 2020-08-17 DIAGNOSIS — Z3A40 40 weeks gestation of pregnancy: Secondary | ICD-10-CM

## 2020-08-17 DIAGNOSIS — Z23 Encounter for immunization: Secondary | ICD-10-CM

## 2020-08-17 DIAGNOSIS — O480 Post-term pregnancy: Principal | ICD-10-CM | POA: Diagnosis present

## 2020-08-17 DIAGNOSIS — Z550 Illiteracy and low-level literacy: Secondary | ICD-10-CM

## 2020-08-17 DIAGNOSIS — Z20822 Contact with and (suspected) exposure to covid-19: Secondary | ICD-10-CM | POA: Diagnosis present

## 2020-08-18 ENCOUNTER — Encounter (HOSPITAL_COMMUNITY): Payer: Self-pay | Admitting: Obstetrics and Gynecology

## 2020-08-18 ENCOUNTER — Other Ambulatory Visit: Payer: Self-pay

## 2020-08-18 ENCOUNTER — Inpatient Hospital Stay (HOSPITAL_COMMUNITY): Payer: Medicaid Other | Admitting: Anesthesiology

## 2020-08-18 ENCOUNTER — Other Ambulatory Visit (HOSPITAL_COMMUNITY): Payer: Self-pay | Attending: Family Medicine

## 2020-08-18 DIAGNOSIS — O4693 Antepartum hemorrhage, unspecified, third trimester: Secondary | ICD-10-CM | POA: Diagnosis present

## 2020-08-18 DIAGNOSIS — Z3A40 40 weeks gestation of pregnancy: Secondary | ICD-10-CM

## 2020-08-18 DIAGNOSIS — Z23 Encounter for immunization: Secondary | ICD-10-CM | POA: Diagnosis not present

## 2020-08-18 DIAGNOSIS — O480 Post-term pregnancy: Secondary | ICD-10-CM | POA: Diagnosis present

## 2020-08-18 DIAGNOSIS — Z20822 Contact with and (suspected) exposure to covid-19: Secondary | ICD-10-CM | POA: Diagnosis present

## 2020-08-18 LAB — RPR: RPR Ser Ql: NONREACTIVE

## 2020-08-18 LAB — CBC
HCT: 36.9 % (ref 36.0–46.0)
Hemoglobin: 11.7 g/dL — ABNORMAL LOW (ref 12.0–15.0)
MCH: 27.4 pg (ref 26.0–34.0)
MCHC: 31.7 g/dL (ref 30.0–36.0)
MCV: 86.4 fL (ref 80.0–100.0)
Platelets: 261 10*3/uL (ref 150–400)
RBC: 4.27 MIL/uL (ref 3.87–5.11)
RDW: 15.3 % (ref 11.5–15.5)
WBC: 7.6 10*3/uL (ref 4.0–10.5)
nRBC: 0 % (ref 0.0–0.2)

## 2020-08-18 LAB — TYPE AND SCREEN
ABO/RH(D): O POS
Antibody Screen: NEGATIVE

## 2020-08-18 LAB — RESPIRATORY PANEL BY RT PCR (FLU A&B, COVID)
Influenza A by PCR: NEGATIVE
Influenza B by PCR: NEGATIVE
SARS Coronavirus 2 by RT PCR: NEGATIVE

## 2020-08-18 MED ORDER — ONDANSETRON HCL 4 MG PO TABS: 4.0000 mg | ORAL_TABLET | ORAL | Status: DC | PRN

## 2020-08-18 MED ORDER — METHYLERGONOVINE MALEATE 0.2 MG/ML IJ SOLN: 0.2000 mg | Freq: Once | INTRAMUSCULAR | Status: DC

## 2020-08-18 MED ORDER — DIPHENHYDRAMINE HCL 50 MG/ML IJ SOLN: 12.5000 mg | INTRAMUSCULAR | Status: DC | PRN

## 2020-08-18 MED ORDER — DIBUCAINE (PERIANAL) 1 % EX OINT: 1.0000 "application " | TOPICAL_OINTMENT | CUTANEOUS | Status: DC | PRN

## 2020-08-18 MED ORDER — SIMETHICONE 80 MG PO CHEW: 80.0000 mg | CHEWABLE_TABLET | ORAL | Status: DC | PRN

## 2020-08-18 MED ORDER — OXYCODONE-ACETAMINOPHEN 5-325 MG PO TABS: 1.0000 | ORAL_TABLET | ORAL | Status: DC | PRN

## 2020-08-18 MED ORDER — COCONUT OIL OIL: 1.0000 "application " | TOPICAL_OIL | Status: DC | PRN

## 2020-08-18 MED ORDER — METHYLERGONOVINE MALEATE 0.2 MG/ML IJ SOLN
INTRAMUSCULAR | Status: AC
  Administered 2020-08-18: 0.2 mg
  Filled 2020-08-18: qty 1

## 2020-08-18 MED ORDER — EPHEDRINE 5 MG/ML INJ: 10.0000 mg | INTRAVENOUS | Status: DC | PRN

## 2020-08-18 MED ORDER — TERBUTALINE SULFATE 1 MG/ML IJ SOLN: 0.2500 mg | Freq: Once | INTRAMUSCULAR | Status: DC | PRN

## 2020-08-18 MED ORDER — WITCH HAZEL-GLYCERIN EX PADS: 1.0000 "application " | MEDICATED_PAD | CUTANEOUS | Status: DC | PRN

## 2020-08-18 MED ORDER — DIPHENHYDRAMINE HCL 25 MG PO CAPS: 25.0000 mg | ORAL_CAPSULE | Freq: Four times a day (QID) | ORAL | Status: DC | PRN

## 2020-08-18 MED ORDER — SOD CITRATE-CITRIC ACID 500-334 MG/5ML PO SOLN: 30.0000 mL | ORAL | Status: DC | PRN

## 2020-08-18 MED ORDER — OXYCODONE-ACETAMINOPHEN 5-325 MG PO TABS: 2.0000 | ORAL_TABLET | ORAL | Status: DC | PRN

## 2020-08-18 MED ORDER — PRENATAL MULTIVITAMIN CH
1.0000 | ORAL_TABLET | Freq: Every day | ORAL | Status: DC
  Filled 2020-08-18: qty 1

## 2020-08-18 MED ORDER — BENZOCAINE-MENTHOL 20-0.5 % EX AERO
1.0000 "application " | INHALATION_SPRAY | CUTANEOUS | Status: DC | PRN
  Administered 2020-08-18: 1 via TOPICAL
  Filled 2020-08-18: qty 56

## 2020-08-18 MED ORDER — ONDANSETRON HCL 4 MG/2ML IJ SOLN: 4.0000 mg | INTRAMUSCULAR | Status: DC | PRN

## 2020-08-18 MED ORDER — TETANUS-DIPHTH-ACELL PERTUSSIS 5-2.5-18.5 LF-MCG/0.5 IM SUSY
0.5000 mL | PREFILLED_SYRINGE | Freq: Once | INTRAMUSCULAR | Status: AC
  Administered 2020-08-19: 0.5 mL via INTRAMUSCULAR
  Filled 2020-08-18: qty 0.5

## 2020-08-18 MED ORDER — IBUPROFEN 800 MG PO TABS
800.0000 mg | ORAL_TABLET | Freq: Three times a day (TID) | ORAL | Status: DC
  Administered 2020-08-18 – 2020-08-19 (×4): 800 mg via ORAL
  Filled 2020-08-18 (×4): qty 1

## 2020-08-18 MED ORDER — PHENYLEPHRINE 40 MCG/ML (10ML) SYRINGE FOR IV PUSH (FOR BLOOD PRESSURE SUPPORT): 80.0000 ug | PREFILLED_SYRINGE | INTRAVENOUS | Status: DC | PRN

## 2020-08-18 MED ORDER — ACETAMINOPHEN 500 MG PO TABS
1000.0000 mg | ORAL_TABLET | Freq: Three times a day (TID) | ORAL | Status: DC
  Administered 2020-08-18 – 2020-08-19 (×4): 1000 mg via ORAL
  Filled 2020-08-18 (×4): qty 2

## 2020-08-18 MED ORDER — MISOPROSTOL 50MCG HALF TABLET
50.0000 ug | ORAL_TABLET | ORAL | Status: DC | PRN
  Administered 2020-08-18: 50 ug via BUCCAL
  Filled 2020-08-18 (×2): qty 1

## 2020-08-18 MED ORDER — LACTATED RINGERS IV SOLN
500.0000 mL | Freq: Once | INTRAVENOUS | Status: AC
  Administered 2020-08-18: 500 mL via INTRAVENOUS

## 2020-08-18 MED ORDER — LACTATED RINGERS IV SOLN: INTRAVENOUS | Status: DC

## 2020-08-18 MED ORDER — ACETAMINOPHEN 325 MG PO TABS: 650.0000 mg | ORAL_TABLET | ORAL | Status: DC | PRN

## 2020-08-18 MED ORDER — OXYTOCIN-SODIUM CHLORIDE 30-0.9 UT/500ML-% IV SOLN
2.5000 [IU]/h | INTRAVENOUS | Status: DC
  Filled 2020-08-18: qty 500

## 2020-08-18 MED ORDER — LIDOCAINE HCL (PF) 1 % IJ SOLN
30.0000 mL | INTRAMUSCULAR | Status: AC | PRN
  Administered 2020-08-18: 30 mL via SUBCUTANEOUS
  Filled 2020-08-18: qty 30

## 2020-08-18 MED ORDER — OXYTOCIN-SODIUM CHLORIDE 30-0.9 UT/500ML-% IV SOLN: 1.0000 m[IU]/min | INTRAVENOUS | Status: DC

## 2020-08-18 MED ORDER — INFLUENZA VAC SPLIT QUAD 0.5 ML IM SUSY
0.5000 mL | PREFILLED_SYRINGE | INTRAMUSCULAR | Status: AC
  Administered 2020-08-19: 0.5 mL via INTRAMUSCULAR
  Filled 2020-08-18: qty 0.5

## 2020-08-18 MED ORDER — LIDOCAINE HCL (PF) 1 % IJ SOLN
INTRAMUSCULAR | Status: DC | PRN
  Administered 2020-08-18 (×2): 4 mL via EPIDURAL

## 2020-08-18 MED ORDER — LACTATED RINGERS IV SOLN: 500.0000 mL | INTRAVENOUS | Status: DC | PRN

## 2020-08-18 MED ORDER — SENNOSIDES-DOCUSATE SODIUM 8.6-50 MG PO TABS
2.0000 | ORAL_TABLET | ORAL | Status: DC
  Administered 2020-08-18: 2 via ORAL
  Filled 2020-08-18: qty 2

## 2020-08-18 MED ORDER — OXYTOCIN BOLUS FROM INFUSION
333.0000 mL | Freq: Once | INTRAVENOUS | Status: AC
  Administered 2020-08-18: 333 mL via INTRAVENOUS

## 2020-08-18 MED ORDER — FENTANYL CITRATE (PF) 100 MCG/2ML IJ SOLN: 100.0000 ug | INTRAMUSCULAR | Status: DC | PRN

## 2020-08-18 MED ORDER — ONDANSETRON HCL 4 MG/2ML IJ SOLN: 4.0000 mg | Freq: Four times a day (QID) | INTRAMUSCULAR | Status: DC | PRN

## 2020-08-18 MED ORDER — FENTANYL-BUPIVACAINE-NACL 0.5-0.125-0.9 MG/250ML-% EP SOLN
12.0000 mL/h | EPIDURAL | Status: DC | PRN
  Filled 2020-08-18: qty 250

## 2020-08-18 MED ORDER — MELATONIN 3 MG PO TABS
3.0000 mg | ORAL_TABLET | Freq: Every day | ORAL | Status: DC
  Filled 2020-08-18 (×2): qty 1

## 2020-08-18 MED ORDER — SODIUM CHLORIDE (PF) 0.9 % IJ SOLN
INTRAMUSCULAR | Status: DC | PRN
  Administered 2020-08-18: 11.5 mL/h via EPIDURAL

## 2020-08-18 NOTE — Anesthesia Preprocedure Evaluation (Signed)
Anesthesia Evaluation  Patient identified by MRN, date of birth, ID band Patient awake    Reviewed: Allergy & Precautions, Patient's Chart, lab work & pertinent test results  Airway Mallampati: II       Dental no notable dental hx.    Pulmonary neg pulmonary ROS,    Pulmonary exam normal breath sounds clear to auscultation       Cardiovascular negative cardio ROS Normal cardiovascular exam Rhythm:Regular Rate:Normal     Neuro/Psych negative neurological ROS  negative psych ROS   GI/Hepatic Neg liver ROS, GERD  ,  Endo/Other  negative endocrine ROS  Renal/GU negative Renal ROS  negative genitourinary   Musculoskeletal negative musculoskeletal ROS (+)   Abdominal   Peds  Hematology  (+) anemia ,   Anesthesia Other Findings   Reproductive/Obstetrics (+) Pregnancy                             Anesthesia Physical Anesthesia Plan  ASA: II  Anesthesia Plan: Epidural   Post-op Pain Management:    Induction:   PONV Risk Score and Plan:   Airway Management Planned: Natural Airway  Additional Equipment:   Intra-op Plan:   Post-operative Plan:   Informed Consent: I have reviewed the patients History and Physical, chart, labs and discussed the procedure including the risks, benefits and alternatives for the proposed anesthesia with the patient or authorized representative who has indicated his/her understanding and acceptance.       Plan Discussed with: Anesthesiologist  Anesthesia Plan Comments: (Will use Spanish interpreter for procedure.)        Anesthesia Quick Evaluation

## 2020-08-18 NOTE — Anesthesia Procedure Notes (Signed)

## 2020-08-18 NOTE — MAU Note (Signed)
Pt reports bleeding since this am, heavier this pm. Has had four contractions all day. Denies problems with pregnancy. Reports good fetal movement.

## 2020-08-18 NOTE — Lactation Note (Signed)
This note was copied from a baby's chart. Lactation Consultation Note  Patient Name: Darlene Gallagher [id number] Date: 08/18/2020 Reason for consult: Initial assessment;Term P3, 10 hour term female infant. [place] in-house interpreter used [place]. Per mom, infant will BF for 10 minutes for feedings. Per mom, she BF 1st child who is 33 years old for 15 months and her 2nd child for 6 months who is currently 69 years old. Mom is active on the [facility] Program in [facility] but she doesn't have a breast pump at home.  Mom 's current feeding plan is breast and formula feeding infant. Mom is active on the [facility] Program in [facility] and she doesn't have a breast pump at home. LC unable to assess latch due infant being formula fed 30 minutes prior to [facility] entering the room. Per mom, she being using formula because she doesn't have any breast  milk to give her baby.  LC discussed hand expression and mom easily expressed 3 mls of colostrum in bullet that she will offer to infant after latching infant at the breast for the next feeding. LC gave mom hand pump to pre-pump breast prior to latching infant duLC gave mom BF supplemental sheet based on infant's age and hours of life if she chooses to supplement infant after latching infant at the breast.e mom having pseduo-inverted nipples and mom understands she can use hand pump after latching infant at the breast and give back her EBM she pumped before giving infant formula ( this is her choice). Mom shown how to use hand pump & how to disassemble, clean, & reassemble parts. Mom made aware of O/P services, breastfeeding support groups, community resources, and our phone # for post-discharge questions.  Mom current plan: 1. Latch infant at breast according to feeding cues, 8 to 12+ times within 24 hours, STS. 2. Mom will supplement infant with her EBM first either by using hand pump or hand expression and give infant back EBM this is her choice before  supplementing with formula. 3. Mom knows to ask RN or LC  for assistance with latching infant at the breast if needed.   Maternal Data Formula Feeding for Exclusion: No Has patient been taught Hand Expression?: Yes Does the patient have breastfeeding experience prior to this delivery?: Yes  Feeding Feeding Type: Formula Nipple Type: Slow - flow  LATCH Score                   Interventions Interventions: Breast feeding basics reviewed;Skin to skin;Expressed milk;Hand express;Pre-pump if needed;Hand pump  Lactation Tools Discussed/Used WIC Program: Yes Pump Review: Setup, frequency, and cleaning;Milk Storage Initiated by:: [name], IBCLC Date initiated:: 08/18/20   Consult Status Consult Status: Follow-up Date: 08/19/20 Follow-up type: In-patient    [name] 08/18/2020, 7:10 PM

## 2020-08-18 NOTE — H&P (Signed)
Darlene Gallagher is a 34 y.o. female presenting for vaginal bleeding at [redacted]w[redacted]d. Patient states she has been contracting all day as well as experiencing vaginal bleeding all day yesterday 08/17/2020 and worsening overnight. She did not don a pad or saturate her clothing but states her bleeding is "very heavy". She rates her pain as 0/10 in triage in MAU but is visibly wincing and moaning through contractions. She is scheduled for induction of labor on 08/20/2020 but verbalizes to CNM her pain is too great and she is agreeable to induction today if recommended. She denies leaking of fluid, decreased fetal movement, fever, falls, or recent illness.   OB History     Gravida  4   Para  2   Term  2   Preterm  0   AB  1   Living  2      SAB  1   TAB  0   Ectopic  0   Multiple  0   Live Births  2          Nursing Staff Provider  Office Location  [facility] Dating  Certain LMP  Language   Spanish Anatomy US  Normal- incomplete > FU completed and was normal   Flu Vaccine   N.A Genetic Screen  NIPS:   Not done AFP: not reported due to missing information, patient did not return for visits w/in a timeframe where this could be done.   TDaP vaccine   Declined-07/29/20 Hgb A1C or  GTT A1c: 5.1 Third trimester 1 hour GTT 99, A1C at 37 weeks 5.6   Rhogam  NA   LAB RESULTS     Blood Type O/Positive/-- (05/13 1221)   Feeding Plan Breast/ Bottle  Antibody Negative (05/13 1221)  Contraception Nexplanon  Rubella 15.50 (05/13 1221)  Circumcision Declines  RPR Non Reactive (10/12 1427)   Pediatrician  List given HBsAg Negative (05/13 1221)   Support Person  FOB HCVAb Negative  Prenatal Classes Info shared  HIV Non Reactive (10/12 1427)     BTL Consent NA GBS  Negative   VBAC Consent  Pap 2020- Normal     Hgb Electro    BP Cuff has her own cuff CF     SMA     Waterbirth  [ ]  Class [ ]  Consent [ ]  CNM visit    Induction  [ ]  Orders Entered [ ] Foley Y/N   Past Medical History:   Diagnosis Date   Medical history non-contributory    Past Surgical History:  Procedure Laterality Date   NO PAST SURGERIES     Family History: family history is not on file. Social History:  reports that she has never smoked. She has never used smokeless tobacco. She reports that she does not drink alcohol and does not use drugs.     Maternal Diabetes: No Genetic Screening: Not performed (see OB box) Maternal Ultrasounds/Referrals: Normal Fetal Ultrasounds or other Referrals:  None Maternal Substance Abuse:  No Significant Maternal Medications:  None Significant Maternal Lab Results:  Group B Strep negative Other Comments:  None  Review of Systems  Gastrointestinal: Positive for abdominal pain.  Genitourinary: Positive for vaginal bleeding.  All other systems reviewed and are negative.  Dilation: 3 Effacement (%): 50 Exam by:: , CNM Blood pressure 121/88, pulse 80, temperature 98.5 F (36.9 C), temperature source Oral, resp. rate 17, height 5\' 2"  (1.575 m), weight 64.4 kg, last menstrual period 11/07/2019, SpO2 99 %. Exam Physical  Exam Vitals and nursing note reviewed. Exam conducted with a chaperone present.  Constitutional:      Appearance: Normal appearance.  Cardiovascular:     Rate and Rhythm: Normal rate.     Pulses: Normal pulses.  Pulmonary:     Effort: Pulmonary effort is normal.  Abdominal:     Comments: Gravid  Neurological:     Mental Status: She is alert.     Prenatal labs: ABO, Rh: O/Positive/-- (05/13 1221) Antibody: Negative (05/13 1221) Rubella: 15.50 (05/13 1221) RPR: Non Reactive (10/12 1427)  HBsAg: Negative (05/13 1221)  HIV: Non Reactive (10/12 1427)  GBS: Negative/-- (10/12 1418)   Assessment/Plan: --34 y.o. [id number] at [redacted]w[redacted]d  --Reactive tracing --GBS neg --Pt inconsistent historian with respect to severity of bleeding for past 24 hours --Current level of bleeding c/w bloody show --3/50/-2, vertex by suture   --Breast and bottle --Nexplanon --Unsure about epidural --Language barrier: remote interpreter utilized for all patient interaction --Admit to L&D, report called to V. Aundria Rud, CNM   Calvert Cantor, CNM 08/18/2020, 1:39 AM

## 2020-08-18 NOTE — Progress Notes (Signed)
LABOR PROGRESS NOTE  Darlene Gallagher is a 34 y.o. [phone] at [redacted]w[redacted]d  admitted for early labor   Subjective: Patient doing well, breathing through contractions - patient is requesting epidural at this time   Objective: BP 138/61   Pulse (!) 110 Comment: verified by pulse ox   Temp 98.2 F (36.8 C) (Oral)   Resp 18   Ht 5\' 2"  (1.575 m)   Wt 64.4 kg   LMP 11/07/2019 (Approximate)   SpO2 99%   BMI 25.97 kg/m  or  Vitals:   08/18/20 0135 08/18/20 0305 08/18/20 0405 08/18/20 0505  BP:  (!) 131/91 120/68 138/61  Pulse:  75 78 (!) 110  Resp: 18     Temp: 98.2 F (36.8 C)     TempSrc: Oral     SpO2:      Weight:      Height:        Dilation: 6 Effacement (%): 80 Station: -1 Presentation: Vertex Exam by:: A. Day, RN FHT: baseline rate 130, moderate varibility, +accel, no decel Toco: 2-3/moderate by palpation   Labs: Lab Results  Component Value Date   WBC 7.6 08/18/2020   HGB 11.7 (L) 08/18/2020   HCT 36.9 08/18/2020   MCV 86.4 08/18/2020   PLT 261 08/18/2020    Patient Active Problem List   Diagnosis Date Noted  . Post-dates pregnancy 08/18/2020  . Supervision of low-risk pregnancy 02/06/2020  . Limited literacy 02/06/2020  . Subchorionic hemorrhage 01/13/2020  . Language barrier affecting health care 01/13/2020    Assessment / Plan: 34 y.o. 32 at [redacted]w[redacted]d here for early labor   Labor: Patient received 1 cytotec then progressed to active labor. Educated and discussed with patient AROM and risks with AROM, patient request to wait until after epidural to rupture  Fetal Wellbeing:  Cat I  Pain Control:  Requested epidural at this time  Anticipated MOD:  SVD  [redacted]w[redacted]d, CNM 08/18/2020, 6:07 AM

## 2020-08-18 NOTE — Progress Notes (Signed)
Patient comfortable with epidural  Vitals:   08/18/20 0645 08/18/20 0650 08/18/20 0655 08/18/20 0700  BP: 125/90 118/70 124/75 106/76  Pulse: 70 74 66 70  Resp:    20  Temp:    98.2 F (36.8 C)  TempSrc:    Oral  SpO2: 98% 98% 99% 99%  Weight:      Height:       AROM - clear fluid @ 0713, small amount   Dilation: 8 Effacement (%): 80 Station: -1 Presentation: Vertex Exam by:: Steward Drone, CNM  Expectant management  CAT I tracing  Plans SVD   Sharyon Cable, CNM 08/18/20, 7:28 AM

## 2020-08-18 NOTE — Anesthesia Postprocedure Evaluation (Signed)
Anesthesia Post Note  Patient: Darlene Gallagher  Procedure(s) Performed: AN AD HOC LABOR EPIDURAL     Patient location during evaluation: Mother Baby Anesthesia Type: Epidural Level of consciousness: awake and alert Pain management: pain level controlled Vital Signs Assessment: post-procedure vital signs reviewed and stable Respiratory status: spontaneous breathing Cardiovascular status: stable Postop Assessment: patient able to bend at knees, no backache, no headache and epidural receding Anesthetic complications: no Comments: Stratus video used for postop visit;pt speaks spanish   No complications documented.  Last Vitals:  Vitals:   08/18/20 1130 08/18/20 1216  BP: 117/71 112/77  Pulse: 68 71  Resp:  17  Temp: 36.9 C 36.8 C  SpO2: 100%     Last Pain:  Vitals:   08/18/20 1216  TempSrc: Oral  PainSc:    Pain Goal:                   Edison Pace

## 2020-08-19 ENCOUNTER — Encounter: Payer: Self-pay | Admitting: General Practice

## 2020-08-19 DIAGNOSIS — Z9889 Other specified postprocedural states: Secondary | ICD-10-CM

## 2020-08-19 LAB — HEMOGLOBIN AND HEMATOCRIT, BLOOD
HCT: 28.4 % — ABNORMAL LOW (ref 36.0–46.0)
Hemoglobin: 9 g/dL — ABNORMAL LOW (ref 12.0–15.0)

## 2020-08-19 MED ORDER — IBUPROFEN 800 MG PO TABS
800.0000 mg | ORAL_TABLET | Freq: Three times a day (TID) | ORAL | 0 refills | Status: DC
Start: 2020-08-19 — End: 2024-01-11

## 2020-08-19 MED ORDER — FERROUS SULFATE 325 (65 FE) MG PO TBEC: 325.0000 mg | DELAYED_RELEASE_TABLET | ORAL | 2 refills | Status: AC

## 2020-08-19 MED ORDER — ACETAMINOPHEN 500 MG PO TABS: 1000.0000 mg | ORAL_TABLET | Freq: Three times a day (TID) | ORAL | 0 refills | Status: AC

## 2020-08-19 NOTE — Discharge Summary (Signed)
Postpartum Discharge Summary  Date of Service updated 08/19/20      Patient Name: Darlene Gallagher DOB: 11/04/1985 MRN: [id number]  Date of admission: 08/17/2020 Delivery date:08/18/2020  Delivering provider: [facility], Joneen Caraway C  Date of discharge: 08/19/2020  Admitting diagnosis: Post-dates pregnancy [O48.0] Intrauterine pregnancy: [redacted]w[redacted]d    Secondary diagnosis:  Active Problems:   Post-dates pregnancy   SVD (spontaneous vaginal delivery)  Additional problems: none    Discharge diagnosis: Term Pregnancy Delivered                                              Post partum procedures:none Augmentation: Cytotec Complications: None  Hospital course: Onset of Labor With Vaginal Delivery      34y.o. yo [id number] admitted in Latent Labor on 08/17/2020. Patient had an uncomplicated labor course as follows:  Membrane Rupture Time/Date: 7:13 AM ,08/18/2020   Delivery Method:Vaginal, Spontaneous  Episiotomy: None  Lacerations:  2nd degree;Labial  Patient had an uncomplicated postpartum course.  She is ambulating, tolerating a regular diet, passing flatus, and urinating well. Patient is discharged home in stable condition on 08/19/20.  Newborn Data: Birth date:08/18/2020  Birth time:9:00 AM  Gender:Female  Living status:Living  Apgars:4 ,9  Weight:3541 g   Magnesium Sulfate received: No BMZ received: No Rhophylac:No MMR:No T-DaP:Given postpartum Flu: No Transfusion:No  Physical exam  Vitals:   08/18/20 1624 08/18/20 2030 08/19/20 0030 08/19/20 0400  BP: 104/67 (!) 97/58 (!) 94/55 110/62  Pulse: 64 85 69 (!) 59  Resp: 16 16 18 18   Temp: 98.4 F (36.9 C) 98.2 F (36.8 C) 98.2 F (36.8 C) 98 F (36.7 C)  TempSrc: Oral Oral Oral Oral  SpO2:  99% 99%   Weight:      Height:       General: alert and no distress Lochia: appropriate Uterine Fundus: firm Incision: N/A DVT Evaluation: No evidence of DVT seen on physical exam. No cords or calf tenderness. No  significant calf/ankle edema. Labs: Lab Results  Component Value Date   WBC 7.6 08/18/2020   HGB 9.0 (L) 08/19/2020   HCT 28.4 (L) 08/19/2020   MCV 86.4 08/18/2020   PLT 261 08/18/2020   CMP Latest Ref Rng & Units 01/12/2020  Glucose 70 - 99 mg/dL 91  BUN 6 - 20 mg/dL 6  Creatinine 0.44 - 1.00 mg/dL 0.41(L)  Sodium 135 - 145 mmol/L 135  Potassium 3.5 - 5.1 mmol/L 3.3(L)  Chloride 98 - 111 mmol/L 104  CO2 22 - 32 mmol/L 22  Calcium 8.9 - 10.3 mg/dL 8.6(L)  Total Protein 6.5 - 8.1 g/dL 6.5  Total Bilirubin 0.3 - 1.2 mg/dL 0.5  Alkaline Phos 38 - 126 U/L 37(L)  AST 15 - 41 U/L 15  ALT 0 - 44 U/L 11   Edinburgh Score: No flowsheet data found.   After visit meds:  Allergies as of 08/19/2020   No Known Allergies     Medication List    STOP taking these medications   terconazole 0.4 % vaginal cream Commonly known as: Terazol 7     TAKE these medications   acetaminophen 500 MG tablet Commonly known as: TYLENOL Take 2 tablets (1,000 mg total) by mouth 3 (three) times daily.   ferrous sulfate 325 (65 FE) MG EC tablet Take 1 tablet (325 mg total) by mouth every other  day.   ibuprofen 800 MG tablet Commonly known as: ADVIL Take 1 tablet (800 mg total) by mouth 3 (three) times daily.   PrePLUS 27-1 MG Tabs Take 1 tablet by mouth daily.        Discharge home in stable condition Infant Feeding: Bottle and Breast Infant Disposition:home with mother Discharge instruction: per After Visit Summary and Postpartum booklet. Activity: Advance as tolerated. Pelvic rest for 6 weeks.  Diet: routine diet Future Appointments:No future appointments. Follow up Visit:   Please schedule this patient for a In person postpartum visit in 4 weeks with the following provider: Any provider. Additional Postpartum F/U:needs Nexplanon placed at [place] risk pregnancy complicated by: n/a Delivery mode:  Vaginal, Spontaneous  Anticipated Birth Control:  Nexplanon   08/19/2020 Clarnce Flock, MD

## 2020-08-19 NOTE — Progress Notes (Cosign Needed)
Post Partum Day 1 Subjective: up ad lib, voiding, tolerating PO, + flatus and concerned about bleeding and reports dizziness when standing  Objective: Blood pressure 110/62, pulse (!) 59, temperature 98 F (36.7 C), temperature source Oral, resp. rate 18, height 5\' 2"  (1.575 m), weight 64.4 kg, last menstrual period 11/07/2019, SpO2 99 %, unknown if currently breastfeeding.  Physical Exam:  General: alert and no distress Lochia: appropriate Uterine Fundus: fundus above umbilicus Incision: n/a DVT Evaluation: No evidence of DVT seen on physical exam. No cords or calf tenderness. No significant calf/ankle edema.  Recent Labs    08/18/20 0053  HGB 11.7*  HCT 36.9    Assessment/Plan: Breastfeeding and Contraception nexplanon outpatient. Desires to go home. Plan to recheck H&H and orthostatic vitals given concern for bleeding and dizziness. Will check fundus with U/S.   LOS: 1 day   13/08/21 08/19/2020, 8:31 AM

## 2020-08-19 NOTE — Progress Notes (Signed)
DC paperwork reviewed with interpreter. Pt asked for financial support at the conclusion of DC teaching. Social work notified, and she said that she would see the patient. This RN also passed on Financial service # [phone] to patient.

## 2020-08-19 NOTE — Discharge Instructions (Signed)
Parto vaginal, cuidados de puerperio Postpartum Care After Vaginal Delivery Lea esta informacin sobre cmo cuidarse desde el momento en que nazca su beb y hasta 6 a 12 semanas despus del parto (perodo del posparto). El mdico tambin podr darle instrucciones ms especficas. Comunquese con su mdico si tiene problemas o preguntas. Siga estas indicaciones en su casa: Hemorragia vaginal  Es normal tener un poco de hemorragia vaginal (loquios) despus del parto. Use un apsito sanitario para el sangrado vaginal y secrecin. ? Durante la primera semana despus del parto, la cantidad y el aspecto de los loquios a menudo es similar a las del perodo menstrual. ? Durante las siguientes semanas disminuir gradualmente hasta convertirse en una secrecin seca amarronada o amarillenta. ? En la mayora de las mujeres, los loquios se detienen completamente entre 4 a 6semanas despus del parto. Los sangrados vaginales pueden variar de mujer a mujer.  Cambie los apsitos sanitarios con frecuencia. Observe si hay cambios en el flujo, como: ? Un aumento repentino en el volumen. ? Cambio en el color. ? Cogulos sanguneos grandes.  Si expulsa un cogulo de sangre por la vagina, gurdelo y llame al mdico para informrselo. No deseche los cogulos de sangre por el inodoro antes de hablar con su mdico.  No use tampones ni se haga duchas vaginales hasta que el mdico la autorice.  Si no est amamantando, volver a tener su perodo entre 6 y 8 semanas despus del parto. Si solamente alimenta al beb con leche materna (lactancia materna exclusiva), podra no volver a tener su perodo hasta que deje de amamantar. Cuidados perineales  Mantenga la zona entre la vagina y el ano (perineo) limpia y seca, como se lo haya indicado el mdico. Utilice apsitos o aerosoles analgsicos y cremas, como se lo hayan indicado.  Si le hicieron un corte en el perineo (episiotoma) o tuvo un desgarro en la vagina, controle la  zona para detectar signos de infeccin hasta que sane. Est atenta a los siguientes signos: ? Aumento del enrojecimiento, la hinchazn o el dolor. ? Presenta lquido o sangre que supura del corte o desgarro. ? Calor. ? Pus o mal olor.  Es posible que le den una botella rociadora para que use en lugar de limpiarse el rea con papel higinico despus de usar el bao. Cuando comience a sanar, podr usar la botella rociadora antes de secarse. Asegrese de secarse suavemente.  Para aliviar el dolor causado por una episiotoma, un desgarro en la vagina o venas hinchadas en el ano (hemorroides), trate de tomar un bao de asiento tibio 2 o 3 veces por da. Un bao de asiento es un bao de agua tibia que se toma mientras se est sentado. El agua solo debe llegar hasta las caderas y cubrir las nalgas. Cuidado de las mamas  En los primeros das despus del parto, las mamas pueden sentirse pesadas, llenas e incmodas (congestin mamaria). Tambin puede escaparse leche de sus senos. El mdico puede sugerirle mtodos para aliviar este malestar. La congestin mamaria debera desaparecer al cabo de unos das.  Si est amamantando: ? Use un sostn que sujete y ajuste bien sus pechos. ? Mantenga los pezones secos y limpios. Aplquese cremas y ungentos, como se lo haya indicado el mdico. ? Es posible que deba usar discos de algodn en el sostn para absorber la leche que se filtre de sus senos. ? Puede tener contracciones uterinas cada vez que amamante durante varias semanas despus del parto. Las contracciones uterinas ayudan al tero a   regresar a su tamao habitual. ? Si tiene algn problema con la lactancia materna, colabore con el mdico o un asesor en lactancia.  Si no est amamantando: ? Evite tocarse mucho las mamas. Al hacerlo, podran producir ms leche. ? Use un sostn que le proporcione el ajuste correcto y compresas fras para reducir la hinchazn. ? No extraiga (saque) leche materna. Esto har que  produzca ms leche. Intimidad y sexualidad  Pregntele al mdico cundo puede retomar la actividad sexual. Esto puede depender de lo siguiente: ? Su riesgo de sufrir infecciones. ? La rapidez con la que est sanando. ? Su comodidad y deseo de retomar la actividad sexual.  Despus del parto, puede quedar embarazada incluso si no ha tenido todava su perodo. Si lo desea, hable con el mdico acerca de los mtodos de control de la natalidad (mtodos anticonceptivos). Medicamentos  Tome los medicamentos de venta libre y los recetados solamente como se lo haya indicado el mdico.  Si le recetaron un antibitico, tmelo como se lo haya indicado el mdico. No deje de tomar el antibitico aunque comience a sentirse mejor. Actividad  Retome sus actividades normales de a poco como se lo haya indicado el mdico. Pregntele al mdico qu actividades son seguras para usted.  Descanse todo lo que pueda. Trate de descansar o tomar una siesta mientras el beb duerme. Comida y bebida   Beba suficiente lquido como para mantener la orina de color amarillo plido.  Coma alimentos ricos en fibras todos los das. Estos pueden ayudarla a prevenir o aliviar el estreimiento. Los alimentos ricos en fibras incluyen, entre otros: ? Panes y cereales integrales. ? Arroz integral. ? Frijoles. ? Frutas y verduras frescas.  No intente perder de peso rpidamente reduciendo el consumo de caloras.  Tome sus vitaminas prenatales hasta la visita de seguimiento de posparto o hasta que su mdico le indique que puede dejar de tomarlas. Estilo de vida  No consuma ningn producto que contenga nicotina o tabaco, como cigarrillos y cigarrillos electrnicos. Si necesita ayuda para dejar de fumar, consulte al mdico.  No beba alcohol, especialmente si est amamantando. Instrucciones generales  Concurra a todas las visitas de seguimiento para usted y el beb, como se lo haya indicado el mdico. La mayora de las mujeres  visita al mdico para un seguimiento de posparto dentro de las primeras 3 a 6 semanas despus del parto. Comunquese con un mdico si:  Se siente incapaz de controlar los cambios que implica tener un hijo y esos sentimientos no desaparecen.  Siente tristeza o preocupacin de forma inusual.  Las mamas se ponen rojas, le duelen o se endurecen.  Tiene fiebre.  Tiene dificultad para retener la orina o para impedir que la orina se escape.  Tiene poco inters o falta de inters en actividades que solan gustarle.  No ha amamantado nada y no ha tenido un perodo menstrual durante 12 semanas despus del parto.  Dej de amamantar al beb y no ha tenido su perodo menstrual durante 12 semanas despus de dejar de amamantar.  Tiene preguntas sobre su cuidado y el del beb.  Elimina un cogulo de sangre grande por la vagina. Solicite ayuda de inmediato si:  Siente dolor en el pecho.  Tiene dificultad para respirar.  Tiene un dolor repentino e intenso en la pierna.  Tiene dolor intenso o clicos en el la parte inferior del abdomen.  Tiene una hemorragia tan intensa de la vagina que empapa ms de un apsito en una hora. El sangrado   no debe ser ms abundante que el perodo ms intenso que haya tenido.  Dolor de cabeza intenso.  Se desmaya.  Tiene visin borrosa o manchas en la vista.  Tiene secrecin vaginal con mal olor.  Tiene pensamientos acerca de lastimarse a usted misma o a su beb. Si alguna vez siente que puede lastimarse a usted misma o a otras personas, o tiene pensamientos de poner fin a su vida, busque ayuda de inmediato. Puede dirigirse al departamento de emergencias ms cercano o llamar a:  El servicio de emergencias de su localidad (911 en [place]  Una lnea de asistencia al suicida y atencin en crisis, como la Lnea Nacional de Prevencin del Suicidio (National Suicide Prevention Lifeline), al 1-800-273-8255. Est disponible las 24 horas del da. Resumen  El  perodo de tiempo justo despus el parto y hasta 6 a 12 semanas despus del parto se denomina perodo posparto.  Retome sus actividades normales de a poco como se lo haya indicado el mdico.  Concurra a todas las visitas de seguimiento para usted y el beb, como se lo haya indicado el mdico. Esta informacin no tiene como fin reemplazar el consejo del mdico. Asegrese de hacerle al mdico cualquier pregunta que tenga. Document Revised: 01/07/2018 Document Reviewed: 09/18/2017 Elsevier Patient Education  2020 [employer]  

## 2020-08-19 NOTE — Lactation Note (Signed)
This note was copied from a baby's chart. Lactation Consultation Note  Patient Name: Darlene Gallagher [id number] Date: 08/19/2020 Reason for consult: Follow-up assessment;Term;Infant weight loss  Visited with mom of a 29 hours FT female, she's a P3. Mom and baby are going home today, baby is at 2% weight loss. Reviewed engorgement prevention/treatment, treatment/prevention for sore nipples, lactogenesis II and red flags on when to call baby's pediatrician.  Mom has been supplementing with formula per current feeding choice, but LC noticed that baby had 45 ml of Similac advance during last feeding. Explained to parents about the small size of baby's stomach and how excessive amounts of supplementation could hurt her supply.  Parent now know that baby has been cluster feeding, mom told LC that she gave her most of the bottle of Similac because they're going home and they're afraid that baby may get hungry and wake up on the way home. Encouraged mom to feed baby at least 8-12 times/24 hours and to pump whenever she's getting formula to protect her supply.  Parents are Spanish speakers; [facility] consultation in parent's preferred language. Dad present and supportive. Parents reported all questions and concerns were answered, they're both aware of LC OP services and will contact PRN.  Maternal Data    Feeding    LATCH Score                   Interventions Interventions: Breast feeding basics reviewed;Hand pump  Lactation Tools Discussed/Used Tools: Pump Breast pump type: Manual   Consult Status Consult Status: Complete Date: 08/19/20 Follow-up type: Call as needed    [name] 08/19/2020, 2:57 PM

## 2020-08-20 ENCOUNTER — Inpatient Hospital Stay (HOSPITAL_COMMUNITY): Payer: Self-pay

## 2020-08-20 ENCOUNTER — Inpatient Hospital Stay (HOSPITAL_COMMUNITY): Admission: AD | Admit: 2020-08-20 | Payer: Self-pay | Source: Home / Self Care | Admitting: Obstetrics and Gynecology

## 2020-10-01 ENCOUNTER — Ambulatory Visit: Payer: Self-pay | Admitting: Family Medicine

## 2020-10-09 ENCOUNTER — Encounter: Payer: Self-pay | Admitting: Family Medicine

## 2024-01-10 ENCOUNTER — Inpatient Hospital Stay (HOSPITAL_COMMUNITY): Payer: Self-pay

## 2024-01-10 ENCOUNTER — Inpatient Hospital Stay (HOSPITAL_COMMUNITY)
Admission: AD | Admit: 2024-01-10 | Discharge: 2024-01-11 | Disposition: A | Discharge status: Home / Self Care | Payer: Self-pay | Attending: Obstetrics and Gynecology | Admitting: Obstetrics and Gynecology

## 2024-01-10 DIAGNOSIS — O4691 Antepartum hemorrhage, unspecified, first trimester: Secondary | ICD-10-CM | POA: Insufficient documentation

## 2024-01-10 DIAGNOSIS — O26891 Other specified pregnancy related conditions, first trimester: Secondary | ICD-10-CM

## 2024-01-10 DIAGNOSIS — O3680X0 Pregnancy with inconclusive fetal viability, not applicable or unspecified: Secondary | ICD-10-CM | POA: Insufficient documentation

## 2024-01-10 DIAGNOSIS — Z3A08 8 weeks gestation of pregnancy: Secondary | ICD-10-CM

## 2024-01-10 DIAGNOSIS — Z3A09 9 weeks gestation of pregnancy: Secondary | ICD-10-CM | POA: Insufficient documentation

## 2024-01-10 DIAGNOSIS — O209 Hemorrhage in early pregnancy, unspecified: Secondary | ICD-10-CM

## 2024-01-10 LAB — WET PREP, GENITAL
Clue Cells Wet Prep HPF POC: NONE SEEN
Sperm: NONE SEEN
Trich, Wet Prep: NONE SEEN
WBC, Wet Prep HPF POC: <10 (ref <10)
Yeast Wet Prep HPF POC: NONE SEEN

## 2024-01-10 LAB — CBC
HCT: 38.2 % (ref 36.0–46.0)
Hemoglobin: 12.8 g/dL (ref 12.0–15.0)
MCH: 29.3 pg (ref 26.0–34.0)
MCHC: 33.5 g/dL (ref 30.0–36.0)
MCV: 87.4 fL (ref 80.0–100.0)
Platelets: 418 10*3/uL — ABNORMAL HIGH (ref 150–400)
RBC: 4.37 MIL/uL (ref 3.87–5.11)
RDW: 12.9 % (ref 11.5–15.5)
WBC: 9.1 10*3/uL (ref 4.0–10.5)
nRBC: 0 % (ref 0.0–0.2)

## 2024-01-10 LAB — HCG, QUANTITATIVE, PREGNANCY: hCG, Beta Chain, Quant, S: 355 m[IU]/mL — ABNORMAL HIGH (ref <5)

## 2024-01-10 LAB — POCT PREGNANCY, URINE: Preg Test, Ur: POSITIVE — AB

## 2024-01-10 MED ORDER — PREPLUS 27-1 MG PO TABS: 1.0000 | ORAL_TABLET | Freq: Every day | ORAL | 13 refills | Status: AC

## 2024-01-10 NOTE — MAU Provider Note (Signed)
 Chief Complaint: Vaginal Bleeding   Event Date/Time   First Provider Initiated Contact with Patient 01/10/24 2031        SUBJECTIVE HPI: Darlene Gallagher is a 38 y.o. [id number] at Unknown by LMP who presents to maternity admissions reporting having had heavy bleeding with clots for 8 days.  Less now.  Wants to know if she is still pregnant.  States she was told she had an "ulcer" on her cervix and would not be able to get pregnant.  Was told it would turn to cancer in 6 years.  Record review shows no paps with last pregnancy.  There is a statement that she had a pap in 2020 that was normal but there is no record of this. . She denies vaginal bleeding, urinary symptoms, n/v, or fever/chills.     Vaginal Bleeding The patient's primary symptoms include vaginal bleeding. The patient's pertinent negatives include no pelvic pain. The current episode started 1 to 4 weeks ago. The problem has been resolved. Pertinent negatives include no abdominal pain (none now), chills, constipation, diarrhea or fever. The vaginal discharge was bloody (now mostly resolved). The vaginal bleeding is heavier than menses. She has been passing clots. She has not been passing tissue.   RN Note: .Darlene Gallagher is a 38 y.o. at Unknown here in MAU reporting vag bleeding for 8 days but the bleeding has stopped now. Passed a lot of clots with the bleeding. She is about [redacted]wks pregnant. She had an u/s at a private clinic at start of pregnancy. Having some lower abd pain. Had difficulty getting pregnant and wants to know if she is still pregnant. Was told she had an ulcer which makes her high risk and pregnancy could worsen the ulcer  LMP: 11/07/23 Onset of complaint: 8days  Past Medical History:  Diagnosis Date   Medical history non-contributory    Past Surgical History:  Procedure Laterality Date   NO PAST SURGERIES     Social History   Socioeconomic History   Marital status: Married    Spouse name:  Celso   Number of children: 2   Years of education: Not on file   Highest education level: Not on file  Occupational History   Not on file  Tobacco Use   Smoking status: Never   Smokeless tobacco: Never  Vaping Use   Vaping status: Never Used  Substance and Sexual Activity   Alcohol use: Never   Drug use: Never   Sexual activity: Yes    Birth control/protection: Condom  Other Topics Concern   Not on file  Social History Narrative   2 children in [place]   Social Drivers of Health   Financial Resource Strain: Not on file  Food Insecurity: No Food Insecurity (02/06/2020)   Hunger Vital Sign    Worried About Running Out of Food in the Last Year: Never true    Ran Out of Food in the Last Year: Never true  Transportation Needs: No Transportation Needs (02/06/2020)   PRAPARE - Administrator, Civil Service (Medical): No    Lack of Transportation (Non-Medical): No  Physical Activity: Not on file  Stress: Not on file  Social Connections: Not on file  Intimate Partner Violence: Not on file   No current facility-administered medications on file prior to encounter.   Current Outpatient Medications on File Prior to Encounter  Medication Sig Dispense Refill   acetaminophen (TYLENOL) 500 MG tablet Take 2 tablets (1,000 mg total)  by mouth 3 (three) times daily. 30 tablet 0   ferrous sulfate 325 (65 FE) MG EC tablet Take 1 tablet (325 mg total) by mouth every other day. 30 tablet 2   ibuprofen (ADVIL) 800 MG tablet Take 1 tablet (800 mg total) by mouth 3 (three) times daily. 30 tablet 0   Prenatal Vit-Fe Fumarate-FA (PREPLUS) 27-1 MG TABS Take 1 tablet by mouth daily. 30 tablet 13   No Known Allergies  I have reviewed patient's Past Medical Hx, Surgical Hx, Family Hx, Social Hx, medications and allergies.   ROS:  Review of Systems  Constitutional:  Negative for chills and fever.  Gastrointestinal:  Negative for abdominal pain (none now), constipation and diarrhea.   Genitourinary:  Positive for vaginal bleeding. Negative for pelvic pain.   Review of Systems  Other systems negative   Physical Exam  Physical Exam Patient Vitals for the past 24 hrs:  BP Temp Pulse Resp SpO2 Height Weight  01/10/24 2006 127/68 98.5 F (36.9 C) 84 17 99 % 5\' 3"  (1.6 m) 66.7 kg   Constitutional: Well-developed, well-nourished female in no acute distress.  Cardiovascular: normal rate Respiratory: normal effort GI: Abd soft, non-tender.  MS: Extremities nontender, no edema, normal ROM Neurologic: Alert and oriented x 4.  PELVIC EXAM: deferred in lieu of ultrasound  LAB RESULTS Results for orders placed or performed during the hospital encounter of 01/10/24 (from the past 24 hours)  Pregnancy, urine POC     Status: Abnormal   Collection Time: 01/10/24  8:43 PM  Result Value Ref Range   Preg Test, Ur POSITIVE (A) NEGATIVE  Wet prep, genital     Status: None   Collection Time: 01/10/24 10:23 PM   Specimen: Vaginal  Result Value Ref Range   Yeast Wet Prep HPF POC NONE SEEN NONE SEEN   Trich, Wet Prep NONE SEEN NONE SEEN   Clue Cells Wet Prep HPF POC NONE SEEN NONE SEEN   WBC, Wet Prep HPF POC <10 <10   Sperm NONE SEEN   CBC     Status: Abnormal   Collection Time: 01/10/24 10:46 PM  Result Value Ref Range   WBC 9.1 4.0 - 10.5 K/uL   RBC 4.37 3.87 - 5.11 MIL/uL   Hemoglobin 12.8 12.0 - 15.0 g/dL   HCT 16.1 09.6 - 04.5 %   MCV 87.4 80.0 - 100.0 fL   MCH 29.3 26.0 - 34.0 pg   MCHC 33.5 30.0 - 36.0 g/dL   RDW 40.9 81.1 - 91.4 %   Platelets 418 (H) 150 - 400 K/uL   nRBC 0.0 0.0 - 0.2 %  hCG, quantitative, pregnancy     Status: Abnormal   Collection Time: 01/10/24 10:46 PM  Result Value Ref Range   hCG, Beta Chain, Quant, S 355 (H) <5 mIU/mL  HIV Antibody (routine testing w rflx)     Status: None   Collection Time: 01/10/24 10:46 PM  Result Value Ref Range   HIV Screen 4th Generation wRfx Non Reactive Non Reactive        IMAGING US OB LESS THAN 14  WEEKS WITH OB TRANSVAGINAL Result Date: 01/10/2024 CLINICAL DATA:  Heavy vaginal bleeding. EXAM: OBSTETRIC <14 WK [place] AND TRANSVAGINAL OB US TECHNIQUE: Both transabdominal and transvaginal ultrasound examinations were performed for complete evaluation of the gestation as well as the maternal uterus, adnexal regions, and pelvic cul-de-sac. Transvaginal technique was performed to assess early pregnancy. COMPARISON:  None Available. FINDINGS: Intrauterine gestational sac: None Yolk  sac:  Not Visualized. Embryo:  Not Visualized. Cardiac Activity: Not Visualized. Heart Rate:   bpm MSD:   mm    w     d CRL:    mm    w    d                  [place] EDC: Subchorionic hemorrhage:  None visualized. Maternal uterus/adnexae: No adnexal mass or free fluid. IMPRESSION: No intrauterine pregnancy visualized. Differential considerations would include early intrauterine pregnancy too early to visualize, spontaneous abortion, or occult ectopic pregnancy. Recommend close clinical followup and serial quantitative beta HCGs and ultrasounds. Electronically Signed   By: Charlett Nose M.D.   On: 01/10/2024 21:44     MAU Management/MDM: I have reviewed the triage vital signs and the nursing notes.   Pertinent labs & imaging results that were available during my care of the patient were reviewed by me and considered in my medical decision making (see chart for details).      I have reviewed her medical records including past results, notes and treatments. Medical, Surgical, and family history were reviewed.  Medications and recent lab tests were reviewed  Ordered usual first trimester r/o ectopic labs.   Pelvic cultures done Will check baseline Ultrasound to rule out ectopic.  This bleeding/pain can represent a normal pregnancy with bleeding, spontaneous abortion or even an ectopic which can be life-threatening.  The process as listed above helps to determine which of these is present.  Reviewed results with patient using  translator Discussed we don't have access to the [place] she had done last week so cannot make a judgement.  HCG level is low which could mean very early pregnancy or possible miscarriage Recommend repeat HCG on Friday morning Discussed after this is resolved, we will arrange to have a new PAP done to reevaluate   ASSESSMENT Pregnancy at [redacted]w[redacted]d by LMP Recent heavy bleeding Pregnancy of unknown location Possible history of abnormal pap  PLAN Discharge home Plan to repeat HCG level in 48 hours in clinic  Will repeat  Ultrasound in about 7-10 days if HCG levels double appropriately  Ectopic precautions   Pt stable at time of discharge. Encouraged to return here if she develops worsening of symptoms, increase in pain, fever, or other concerning symptoms.    Wynelle Bourgeois CNM, MSN Certified Nurse-Midwife 01/10/2024  8:31 PM

## 2024-01-10 NOTE — MAU Note (Addendum)
.  Darlene Gallagher is a 38 y.o. at Unknown here in MAU reporting vag bleeding for 8 days but the bleeding has stopped now. Passed a lot of clots with the bleeding. She is about [redacted]wks pregnant. She had an u/s at a private clinic at start of pregnancy. Having some lower abd pain. Had difficulty getting pregnant and wants to know if she is still pregnant. Was told she had an ulcer which makes her high risk and pregnancy could worsen the ulcer  LMP: 11/07/23 Onset of complaint: 8days Pain score: 2 Vitals:   01/10/24 2006  BP: 127/68  Pulse: 84  Resp: 17  Temp: 98.5 F (36.9 C)  SpO2: 99%     FHT: n/a  Lab orders placed from triage: upt

## 2024-01-11 ENCOUNTER — Encounter: Payer: Self-pay | Admitting: Advanced Practice Midwife

## 2024-01-11 DIAGNOSIS — O209 Hemorrhage in early pregnancy, unspecified: Secondary | ICD-10-CM

## 2024-01-11 DIAGNOSIS — Z3A08 8 weeks gestation of pregnancy: Secondary | ICD-10-CM

## 2024-01-11 DIAGNOSIS — O3680X0 Pregnancy with inconclusive fetal viability, not applicable or unspecified: Secondary | ICD-10-CM

## 2024-01-11 DIAGNOSIS — R102 Pelvic and perineal pain: Secondary | ICD-10-CM

## 2024-01-11 DIAGNOSIS — O26891 Other specified pregnancy related conditions, first trimester: Secondary | ICD-10-CM

## 2024-01-11 LAB — GC/CHLAMYDIA PROBE AMP (~~LOC~~) NOT AT ARMC
Chlamydia: NEGATIVE
Comment: NEGATIVE
Comment: NORMAL
Neisseria Gonorrhea: NEGATIVE

## 2024-01-11 LAB — HIV ANTIBODY (ROUTINE TESTING W REFLEX): HIV Screen 4th Generation wRfx: NONREACTIVE

## 2024-01-11 NOTE — Progress Notes (Signed)
 Written and verbal d/c instructions given by Wynelle Bourgeois CNM using video interpreter, and pt voiced understanding

## 2024-01-19 ENCOUNTER — Telehealth: Payer: Self-pay | Admitting: Family Medicine

## 2024-01-19 NOTE — Telephone Encounter (Signed)
 Called patient and there was no answer. Called her other number and a female answered and said she was at home and that he will let her know to give [place] a call... she needs to be scheduled for an STAT asap.
